# Patient Record
Sex: Male | Born: 1950 | Race: Black or African American | Hispanic: No | Marital: Married | Smoking: Never smoker
Health system: Southern US, Community
[De-identification: ages and names within clinical notes are randomized; demographics above are authoritative.]

## PROBLEM LIST (undated history)

## (undated) DIAGNOSIS — N529 Male erectile dysfunction, unspecified: Secondary | ICD-10-CM

## (undated) DIAGNOSIS — E119 Type 2 diabetes mellitus without complications: Secondary | ICD-10-CM

## (undated) DIAGNOSIS — I10 Essential (primary) hypertension: Secondary | ICD-10-CM

## (undated) DIAGNOSIS — M25512 Pain in left shoulder: Secondary | ICD-10-CM

## (undated) DIAGNOSIS — E7800 Pure hypercholesterolemia, unspecified: Secondary | ICD-10-CM

## (undated) DIAGNOSIS — I639 Cerebral infarction, unspecified: Secondary | ICD-10-CM

## (undated) DIAGNOSIS — R0683 Snoring: Secondary | ICD-10-CM

## (undated) DIAGNOSIS — J309 Allergic rhinitis, unspecified: Secondary | ICD-10-CM

## (undated) DIAGNOSIS — E782 Mixed hyperlipidemia: Secondary | ICD-10-CM

## (undated) DIAGNOSIS — R5383 Other fatigue: Secondary | ICD-10-CM

## (undated) DIAGNOSIS — M1990 Unspecified osteoarthritis, unspecified site: Secondary | ICD-10-CM

## (undated) DIAGNOSIS — K219 Gastro-esophageal reflux disease without esophagitis: Secondary | ICD-10-CM

## (undated) HISTORY — DX: Mixed hyperlipidemia: E78.2

## (undated) HISTORY — DX: Unspecified osteoarthritis, unspecified site: M19.90

## (undated) HISTORY — DX: Snoring: R06.83

## (undated) HISTORY — DX: Morbid (severe) obesity due to excess calories: E66.01

## (undated) HISTORY — DX: Other fatigue: R53.83

## (undated) HISTORY — DX: Male erectile dysfunction, unspecified: N52.9

## (undated) HISTORY — DX: Pain in left shoulder: M25.512

## (undated) HISTORY — DX: Allergic rhinitis, unspecified: J30.9

---

## 2001-06-08 ENCOUNTER — Ambulatory Visit (HOSPITAL_COMMUNITY): Admission: RE | Admit: 2001-06-08 | Discharge: 2001-06-08 | Payer: Self-pay | Admitting: *Deleted

## 2001-06-08 ENCOUNTER — Encounter (INDEPENDENT_AMBULATORY_CARE_PROVIDER_SITE_OTHER): Payer: Self-pay | Admitting: Specialist

## 2002-04-24 ENCOUNTER — Emergency Department (HOSPITAL_COMMUNITY): Admission: EM | Admit: 2002-04-24 | Discharge: 2002-04-24 | Payer: Self-pay | Admitting: Emergency Medicine

## 2002-04-24 ENCOUNTER — Encounter: Payer: Self-pay | Admitting: Gastroenterology

## 2004-04-05 HISTORY — PX: SHOULDER OPEN ROTATOR CUFF REPAIR: SHX2407

## 2005-03-01 ENCOUNTER — Encounter: Admission: RE | Admit: 2005-03-01 | Discharge: 2005-03-01 | Payer: Self-pay | Admitting: Family Medicine

## 2005-10-14 ENCOUNTER — Emergency Department (HOSPITAL_COMMUNITY): Admission: EM | Admit: 2005-10-14 | Discharge: 2005-10-14 | Payer: Self-pay | Admitting: Emergency Medicine

## 2005-10-22 ENCOUNTER — Encounter: Admission: RE | Admit: 2005-10-22 | Discharge: 2005-10-22 | Payer: Self-pay | Admitting: Family Medicine

## 2005-11-04 ENCOUNTER — Encounter: Admission: RE | Admit: 2005-11-04 | Discharge: 2005-11-04 | Payer: Self-pay | Admitting: Family Medicine

## 2005-11-25 ENCOUNTER — Encounter: Admission: RE | Admit: 2005-11-25 | Discharge: 2005-11-25 | Payer: Self-pay | Admitting: Family Medicine

## 2005-11-25 ENCOUNTER — Other Ambulatory Visit: Admission: RE | Admit: 2005-11-25 | Discharge: 2005-11-25 | Payer: Self-pay | Admitting: Interventional Radiology

## 2005-11-25 ENCOUNTER — Encounter (INDEPENDENT_AMBULATORY_CARE_PROVIDER_SITE_OTHER): Payer: Self-pay | Admitting: *Deleted

## 2006-07-21 ENCOUNTER — Emergency Department (HOSPITAL_COMMUNITY): Admission: EM | Admit: 2006-07-21 | Discharge: 2006-07-21 | Payer: Self-pay | Admitting: Emergency Medicine

## 2006-08-24 ENCOUNTER — Encounter: Admission: RE | Admit: 2006-08-24 | Discharge: 2006-08-24 | Payer: Self-pay | Admitting: Family Medicine

## 2006-08-30 ENCOUNTER — Encounter: Admission: RE | Admit: 2006-08-30 | Discharge: 2006-08-30 | Payer: Self-pay | Admitting: Family Medicine

## 2006-12-02 IMAGING — US US BIOPSY
1 series · 8 of 8 positions shown · non-contrast
Comparison: 11/04/05.

CLINICAL DATA: 55 year old male with a left thyroid mass demonstrated on a carotid ultrasound with a subsequent thyroid ultrasound performed 11/04/05 confirming the left thyroid mass.  
ULTRASOUND GUIDED LEFT THYROID MASS FNA BIOPSY:

[Series 1: unknown · 0.07mm/px · 8 of 8 slices shown]
[im 1/8]
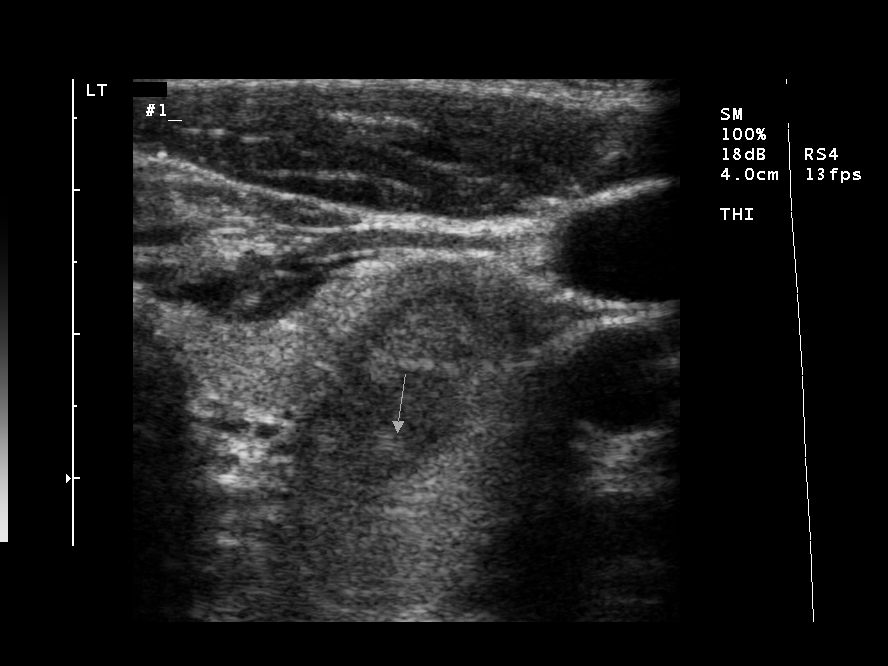
[im 2/8]
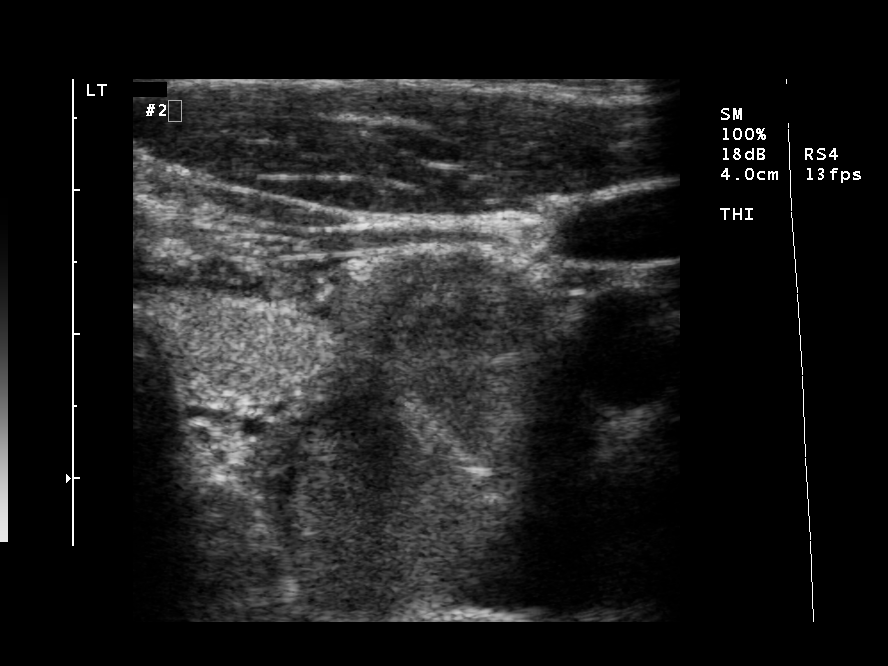
[im 3/8]
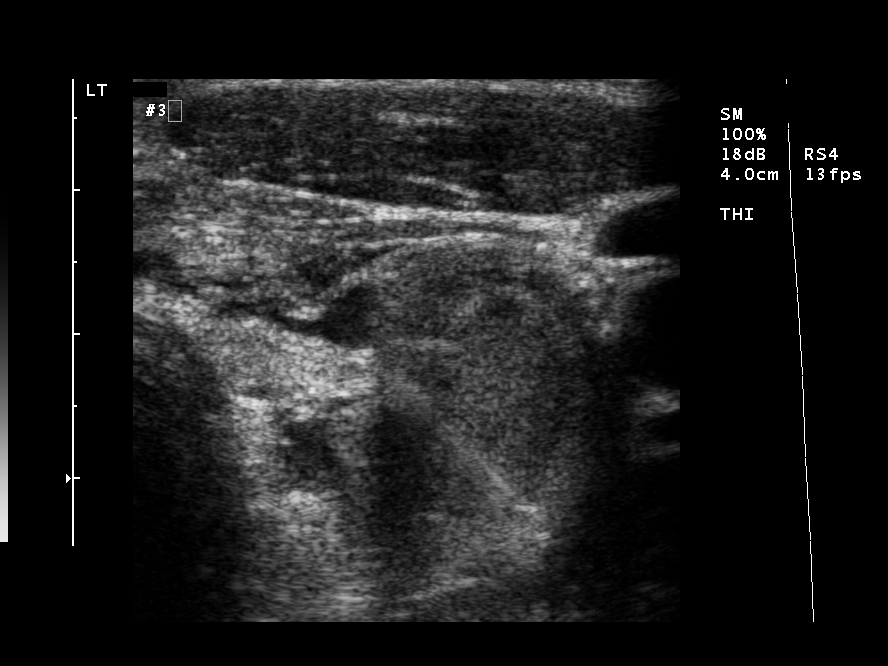
[im 4/8]
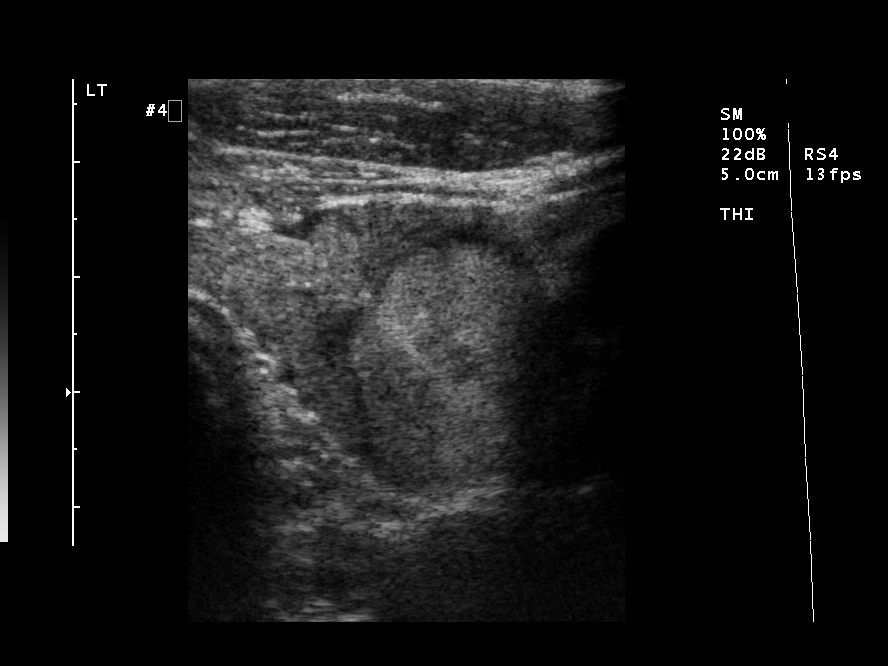
[im 5/8]
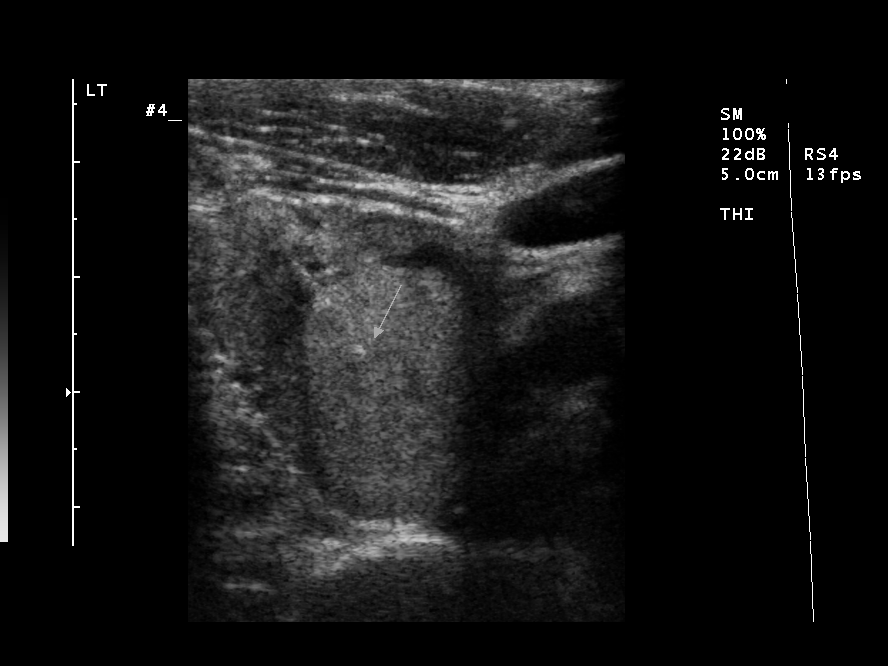
[im 6/8]
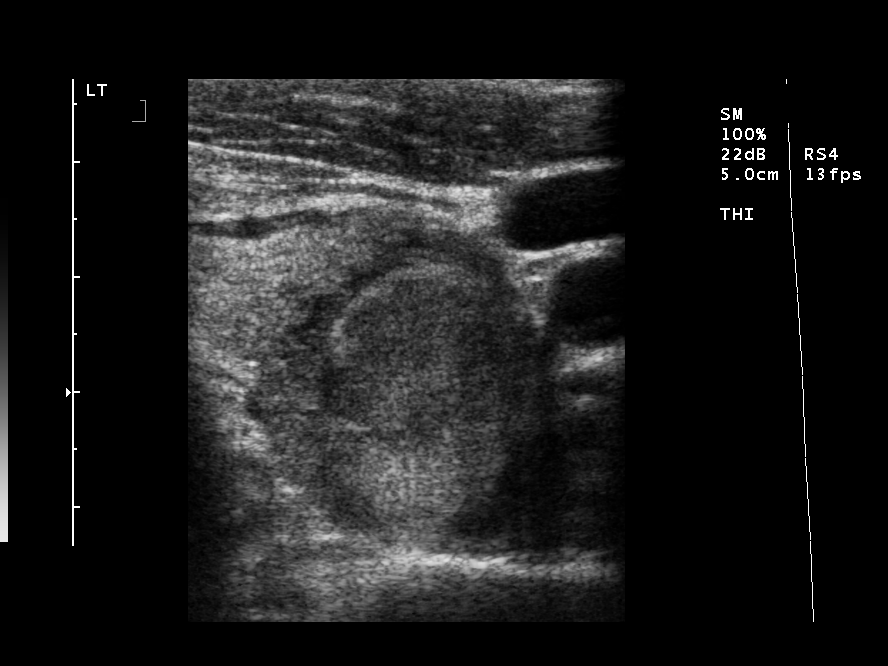
[im 7/8]
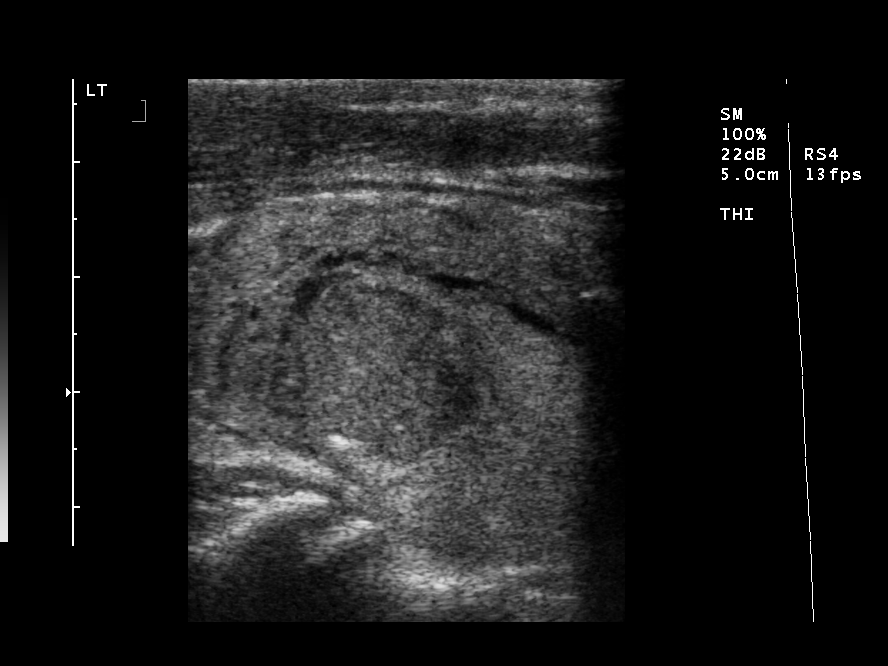
[im 8/8]
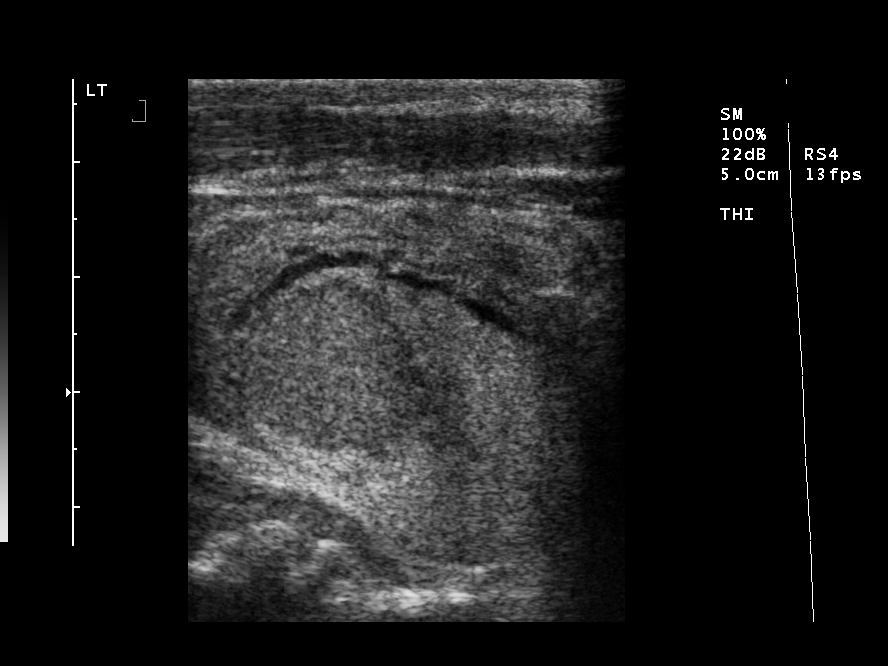

[8 of 8 positions shown; findings below may reference images not displayed]

Radiologist:  Edwuiin Gahona, M.D.
Guidance:  Ultrasound. 
Complications:  No immediate complications. 
Procedure/Findings:  Informed consent was obtained from the patient. 
Preliminary ultrasound was performed of the left thyroid lobe redemonstrating the heterogeneous left thyroid mass.  Under sterile conditions and local anesthesia, 21 gauge Inrad needles were utilized to perform FNA biopsy four times.  Samples were prepared for the cytopathologist.  Post procedure imaging demonstrated no complication.  The patient tolerated the procedure well.
IMPRESSION: 1.  Ultrasound guided left thyroid mass FNA biopsy (four).

## 2006-12-17 ENCOUNTER — Emergency Department (HOSPITAL_COMMUNITY): Admission: EM | Admit: 2006-12-17 | Discharge: 2006-12-17 | Payer: Self-pay | Admitting: Emergency Medicine

## 2007-04-06 DIAGNOSIS — I639 Cerebral infarction, unspecified: Secondary | ICD-10-CM

## 2007-04-06 HISTORY — DX: Cerebral infarction, unspecified: I63.9

## 2007-06-13 ENCOUNTER — Encounter: Admission: RE | Admit: 2007-06-13 | Discharge: 2007-06-13 | Payer: Self-pay | Admitting: Family Medicine

## 2007-07-05 ENCOUNTER — Encounter: Admission: RE | Admit: 2007-07-05 | Discharge: 2007-07-05 | Payer: Self-pay | Admitting: Family Medicine

## 2007-07-28 IMAGING — CR DG CHEST 1V PORT
1 series · 1 of 1 positions shown · non-contrast
Comparison: None.

CLINICAL DATA: Chest pain. 
 PORTABLE CHEST ? 1 VIEW:

[view not recorded]
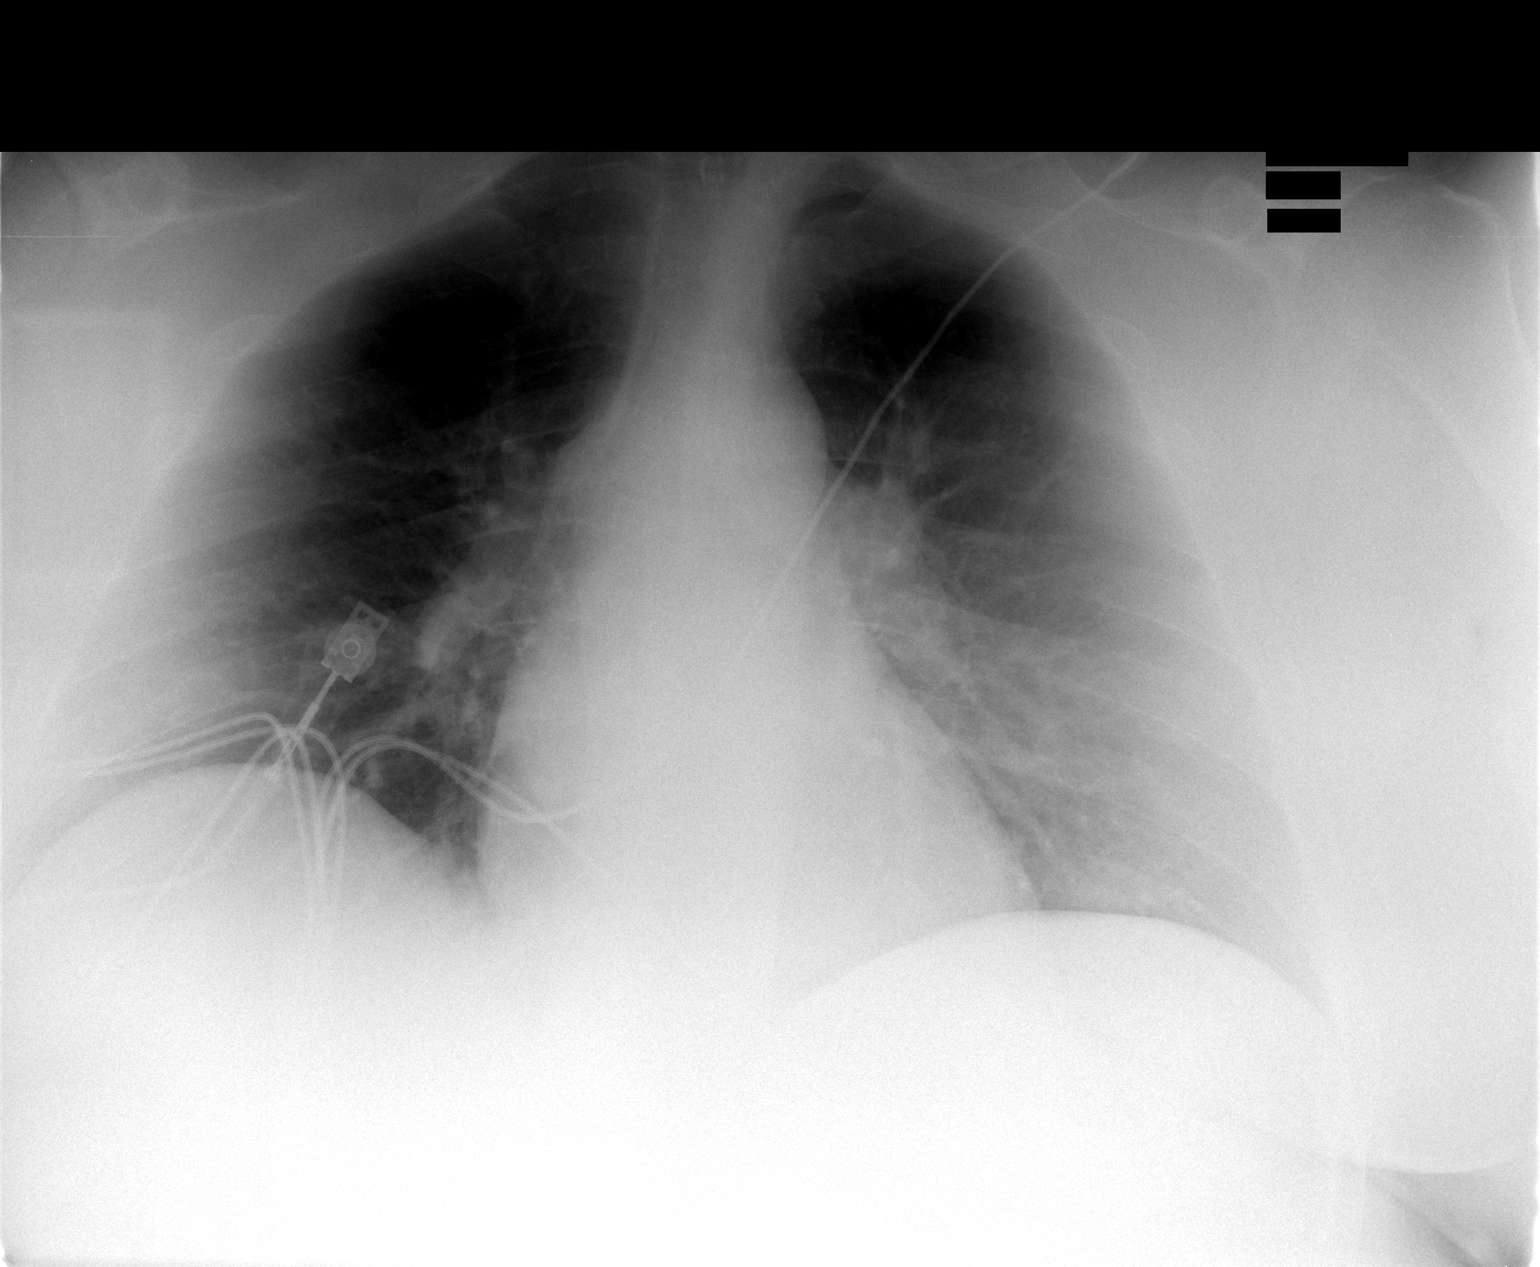

[1 of 1 positions shown; findings below may reference images not displayed]

FINDINGS: Heart and vascularity within normal limits.  Lungs clear.  Osseous structures intact in one view.  The patient is in a lordotic position.
IMPRESSION: No active disease.

## 2007-11-29 ENCOUNTER — Emergency Department (HOSPITAL_COMMUNITY): Admission: EM | Admit: 2007-11-29 | Discharge: 2007-11-29 | Payer: Self-pay | Admitting: Emergency Medicine

## 2007-12-13 ENCOUNTER — Ambulatory Visit (HOSPITAL_COMMUNITY): Admission: RE | Admit: 2007-12-13 | Discharge: 2007-12-13 | Payer: Self-pay | Admitting: Orthopaedic Surgery

## 2009-05-30 ENCOUNTER — Encounter: Admission: RE | Admit: 2009-05-30 | Discharge: 2009-05-30 | Payer: Self-pay | Admitting: Family Medicine

## 2011-01-06 LAB — COMPREHENSIVE METABOLIC PANEL
ALT: 22
Alkaline Phosphatase: 73
Calcium: 9.9
Creatinine, Ser: 1.23
Glucose, Bld: 500 — ABNORMAL HIGH
Sodium: 130 — ABNORMAL LOW
Total Bilirubin: 1
Total Protein: 7.1

## 2011-01-06 LAB — DIFFERENTIAL
Basophils Absolute: 0
Basophils Relative: 0
Eosinophils Absolute: 0.1
Monocytes Relative: 10
Neutro Abs: 2.2

## 2011-01-06 LAB — CBC
Hemoglobin: 16.8
Platelets: 476 — ABNORMAL HIGH

## 2011-01-06 LAB — PROTIME-INR: INR: 1

## 2011-01-15 LAB — POCT I-STAT CREATININE: Operator id: 277751

## 2011-01-15 LAB — I-STAT 8, (EC8 V) (CONVERTED LAB)
Acid-Base Excess: 1
Chloride: 106
Glucose, Bld: 109 — ABNORMAL HIGH
Hemoglobin: 17
Operator id: 277751
Sodium: 138
pCO2, Ven: 29.3 — ABNORMAL LOW

## 2011-12-17 ENCOUNTER — Encounter (HOSPITAL_BASED_OUTPATIENT_CLINIC_OR_DEPARTMENT_OTHER): Payer: Medicare Other | Attending: General Surgery

## 2011-12-17 DIAGNOSIS — S81009A Unspecified open wound, unspecified knee, initial encounter: Secondary | ICD-10-CM | POA: Insufficient documentation

## 2011-12-17 DIAGNOSIS — L97809 Non-pressure chronic ulcer of other part of unspecified lower leg with unspecified severity: Secondary | ICD-10-CM | POA: Insufficient documentation

## 2011-12-17 DIAGNOSIS — W268XXA Contact with other sharp object(s), not elsewhere classified, initial encounter: Secondary | ICD-10-CM | POA: Insufficient documentation
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
## 2012-01-07 ENCOUNTER — Encounter (HOSPITAL_BASED_OUTPATIENT_CLINIC_OR_DEPARTMENT_OTHER): Payer: Medicare Other | Attending: General Surgery

## 2012-01-07 DIAGNOSIS — M79609 Pain in unspecified limb: Secondary | ICD-10-CM | POA: Insufficient documentation

## 2012-01-07 DIAGNOSIS — E1169 Type 2 diabetes mellitus with other specified complication: Secondary | ICD-10-CM | POA: Insufficient documentation

## 2012-01-07 DIAGNOSIS — L97809 Non-pressure chronic ulcer of other part of unspecified lower leg with unspecified severity: Secondary | ICD-10-CM | POA: Insufficient documentation

## 2012-01-14 ENCOUNTER — Encounter (HOSPITAL_BASED_OUTPATIENT_CLINIC_OR_DEPARTMENT_OTHER): Payer: Medicaid Other

## 2012-02-04 ENCOUNTER — Encounter (HOSPITAL_BASED_OUTPATIENT_CLINIC_OR_DEPARTMENT_OTHER): Payer: Medicare Other | Attending: General Surgery

## 2012-02-04 DIAGNOSIS — E1169 Type 2 diabetes mellitus with other specified complication: Secondary | ICD-10-CM | POA: Insufficient documentation

## 2012-02-04 DIAGNOSIS — L97809 Non-pressure chronic ulcer of other part of unspecified lower leg with unspecified severity: Secondary | ICD-10-CM | POA: Insufficient documentation
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
## 2012-03-10 ENCOUNTER — Encounter (HOSPITAL_BASED_OUTPATIENT_CLINIC_OR_DEPARTMENT_OTHER): Payer: Medicaid Other

## 2012-05-29 ENCOUNTER — Emergency Department (HOSPITAL_COMMUNITY): Payer: No Typology Code available for payment source

## 2012-05-29 ENCOUNTER — Encounter (HOSPITAL_COMMUNITY): Payer: Self-pay | Admitting: Emergency Medicine

## 2012-05-29 ENCOUNTER — Emergency Department (HOSPITAL_COMMUNITY)
Admission: EM | Admit: 2012-05-29 | Discharge: 2012-05-29 | Disposition: A | Discharge status: Home / Self Care | Payer: No Typology Code available for payment source | Attending: Emergency Medicine | Admitting: Emergency Medicine

## 2012-05-29 DIAGNOSIS — E119 Type 2 diabetes mellitus without complications: Secondary | ICD-10-CM | POA: Insufficient documentation

## 2012-05-29 DIAGNOSIS — IMO0002 Reserved for concepts with insufficient information to code with codable children: Secondary | ICD-10-CM | POA: Insufficient documentation

## 2012-05-29 DIAGNOSIS — I10 Essential (primary) hypertension: Secondary | ICD-10-CM | POA: Insufficient documentation

## 2012-05-29 DIAGNOSIS — Y92410 Unspecified street and highway as the place of occurrence of the external cause: Secondary | ICD-10-CM | POA: Insufficient documentation

## 2012-05-29 DIAGNOSIS — S161XXA Strain of muscle, fascia and tendon at neck level, initial encounter: Secondary | ICD-10-CM

## 2012-05-29 DIAGNOSIS — Y9389 Activity, other specified: Secondary | ICD-10-CM | POA: Insufficient documentation

## 2012-05-29 DIAGNOSIS — S139XXA Sprain of joints and ligaments of unspecified parts of neck, initial encounter: Secondary | ICD-10-CM | POA: Insufficient documentation

## 2012-05-29 HISTORY — DX: Essential (primary) hypertension: I10

## 2012-05-29 MED ORDER — HYDROCODONE-ACETAMINOPHEN 5-325 MG PO TABS: 1.0000 | ORAL_TABLET | ORAL | Status: DC | PRN

## 2012-05-29 MED ORDER — HYDROMORPHONE HCL PF 1 MG/ML IJ SOLN
0.5000 mg | Freq: Once | INTRAMUSCULAR | Status: AC
  Administered 2012-05-29: 0.5 mg via INTRAMUSCULAR
  Filled 2012-05-29: qty 1

## 2012-05-29 MED ORDER — IBUPROFEN 600 MG PO TABS: 600.0000 mg | ORAL_TABLET | Freq: Three times a day (TID) | ORAL | Status: DC | PRN
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
## 2013-06-15 ENCOUNTER — Emergency Department (HOSPITAL_COMMUNITY): Payer: Medicare Other

## 2013-06-15 ENCOUNTER — Encounter (HOSPITAL_COMMUNITY): Payer: Self-pay | Admitting: General Practice

## 2013-06-15 ENCOUNTER — Observation Stay (HOSPITAL_COMMUNITY)
Admission: EM | Admit: 2013-06-15 | Discharge: 2013-06-16 | Disposition: A | Discharge status: Home / Self Care | Payer: Medicare Other | Attending: Internal Medicine | Admitting: Internal Medicine

## 2013-06-15 DIAGNOSIS — R0602 Shortness of breath: Secondary | ICD-10-CM | POA: Diagnosis not present

## 2013-06-15 DIAGNOSIS — Z79899 Other long term (current) drug therapy: Secondary | ICD-10-CM | POA: Insufficient documentation

## 2013-06-15 DIAGNOSIS — R079 Chest pain, unspecified: Secondary | ICD-10-CM | POA: Diagnosis present

## 2013-06-15 DIAGNOSIS — E785 Hyperlipidemia, unspecified: Secondary | ICD-10-CM | POA: Insufficient documentation

## 2013-06-15 DIAGNOSIS — Z888 Allergy status to other drugs, medicaments and biological substances status: Secondary | ICD-10-CM | POA: Diagnosis not present

## 2013-06-15 DIAGNOSIS — K219 Gastro-esophageal reflux disease without esophagitis: Secondary | ICD-10-CM | POA: Diagnosis not present

## 2013-06-15 DIAGNOSIS — N390 Urinary tract infection, site not specified: Secondary | ICD-10-CM | POA: Diagnosis not present

## 2013-06-15 DIAGNOSIS — R197 Diarrhea, unspecified: Secondary | ICD-10-CM | POA: Insufficient documentation

## 2013-06-15 DIAGNOSIS — R42 Dizziness and giddiness: Secondary | ICD-10-CM | POA: Insufficient documentation

## 2013-06-15 DIAGNOSIS — R0789 Other chest pain: Secondary | ICD-10-CM | POA: Diagnosis not present

## 2013-06-15 DIAGNOSIS — Z23 Encounter for immunization: Secondary | ICD-10-CM | POA: Diagnosis not present

## 2013-06-15 DIAGNOSIS — I10 Essential (primary) hypertension: Secondary | ICD-10-CM | POA: Diagnosis not present

## 2013-06-15 DIAGNOSIS — R5381 Other malaise: Secondary | ICD-10-CM | POA: Insufficient documentation

## 2013-06-15 DIAGNOSIS — Z7982 Long term (current) use of aspirin: Secondary | ICD-10-CM | POA: Insufficient documentation

## 2013-06-15 DIAGNOSIS — R112 Nausea with vomiting, unspecified: Secondary | ICD-10-CM | POA: Diagnosis not present

## 2013-06-15 DIAGNOSIS — E119 Type 2 diabetes mellitus without complications: Secondary | ICD-10-CM | POA: Diagnosis not present

## 2013-06-15 DIAGNOSIS — R5383 Other fatigue: Secondary | ICD-10-CM

## 2013-06-15 HISTORY — DX: Type 2 diabetes mellitus without complications: E11.9

## 2013-06-15 HISTORY — DX: Pure hypercholesterolemia, unspecified: E78.00

## 2013-06-15 HISTORY — DX: Gastro-esophageal reflux disease without esophagitis: K21.9

## 2013-06-15 HISTORY — DX: Cerebral infarction, unspecified: I63.9

## 2013-06-15 LAB — CBC WITH DIFFERENTIAL/PLATELET
BASOS ABS: 0.1 10*3/uL (ref 0.0–0.1)
Basophils Relative: 1 % (ref 0–1)
Eosinophils Absolute: 0.1 10*3/uL (ref 0.0–0.7)
Eosinophils Relative: 2 % (ref 0–5)
HEMATOCRIT: 43.1 % (ref 39.0–52.0)
HEMOGLOBIN: 15.5 g/dL (ref 13.0–17.0)
LYMPHS PCT: 39 % (ref 12–46)
Lymphs Abs: 2.5 10*3/uL (ref 0.7–4.0)
MCH: 31.6 pg (ref 26.0–34.0)
MCHC: 36 g/dL (ref 30.0–36.0)
MCV: 88 fL (ref 78.0–100.0)
MONO ABS: 0.5 10*3/uL (ref 0.1–1.0)
MONOS PCT: 7 % (ref 3–12)
NEUTROS ABS: 3.4 10*3/uL (ref 1.7–7.7)
Neutrophils Relative %: 51 % (ref 43–77)
Platelets: 404 10*3/uL — ABNORMAL HIGH (ref 150–400)
RBC: 4.9 MIL/uL (ref 4.22–5.81)
RDW: 12.8 % (ref 11.5–15.5)
WBC: 6.6 10*3/uL (ref 4.0–10.5)

## 2013-06-15 LAB — CBG MONITORING, ED
GLUCOSE-CAPILLARY: 400 mg/dL — AB (ref 70–99)
Glucose-Capillary: 343 mg/dL — ABNORMAL HIGH (ref 70–99)

## 2013-06-15 LAB — BASIC METABOLIC PANEL
BUN: 11 mg/dL (ref 6–23)
CHLORIDE: 94 meq/L — AB (ref 96–112)
CO2: 22 meq/L (ref 19–32)
CREATININE: 1.09 mg/dL (ref 0.50–1.35)
Calcium: 9 mg/dL (ref 8.4–10.5)
GFR calc Af Amer: 83 mL/min — ABNORMAL LOW (ref >90)
GFR calc non Af Amer: 71 mL/min — ABNORMAL LOW (ref >90)
Glucose, Bld: 431 mg/dL — ABNORMAL HIGH (ref 70–99)
Potassium: 4.7 mEq/L (ref 3.7–5.3)
Sodium: 134 mEq/L — ABNORMAL LOW (ref 137–147)

## 2013-06-15 LAB — URINALYSIS, ROUTINE W REFLEX MICROSCOPIC
BILIRUBIN URINE: NEGATIVE
Glucose, UA: >1000 mg/dL — AB
Ketones, ur: >80 mg/dL — AB
Leukocytes, UA: NEGATIVE
NITRITE: POSITIVE — AB
PH: 5 (ref 5.0–8.0)
Protein, ur: NEGATIVE mg/dL
SPECIFIC GRAVITY, URINE: 1.039 — AB (ref 1.005–1.030)
Urobilinogen, UA: 0.2 mg/dL (ref 0.0–1.0)

## 2013-06-15 LAB — HEMOGLOBIN A1C
HEMOGLOBIN A1C: 10.8 % — AB (ref <5.7)
Mean Plasma Glucose: 263 mg/dL — ABNORMAL HIGH (ref <117)

## 2013-06-15 LAB — URINE MICROSCOPIC-ADD ON

## 2013-06-15 LAB — I-STAT TROPONIN, ED: Troponin i, poc: 0 ng/mL (ref 0.00–0.08)

## 2013-06-15 LAB — GLUCOSE, CAPILLARY
GLUCOSE-CAPILLARY: 274 mg/dL — AB (ref 70–99)
Glucose-Capillary: 356 mg/dL — ABNORMAL HIGH (ref 70–99)

## 2013-06-15 LAB — TROPONIN I: Troponin I: <0.3 ng/mL (ref <0.30)

## 2013-06-15 MED ORDER — INSULIN ASPART 100 UNIT/ML ~~LOC~~ SOLN
0.0000 [IU] | SUBCUTANEOUS | Status: DC
Start: 2013-06-15 — End: 2013-06-15
  Administered 2013-06-15: 9 [IU] via SUBCUTANEOUS

## 2013-06-15 MED ORDER — CEFTRIAXONE SODIUM 1 G IJ SOLR
1.0000 g | Freq: Once | INTRAMUSCULAR | Status: AC
Start: 2013-06-15 — End: 2013-06-15
  Administered 2013-06-15: 1 g via INTRAVENOUS
  Filled 2013-06-15: qty 10

## 2013-06-15 MED ORDER — ASPIRIN EC 81 MG PO TBEC
81.0000 mg | DELAYED_RELEASE_TABLET | Freq: Every morning | ORAL | Status: DC
  Administered 2013-06-16: 81 mg via ORAL
  Filled 2013-06-15: qty 1

## 2013-06-15 MED ORDER — PANTOPRAZOLE SODIUM 40 MG PO TBEC
40.0000 mg | DELAYED_RELEASE_TABLET | Freq: Two times a day (BID) | ORAL | Status: DC
  Administered 2013-06-15 – 2013-06-16 (×2): 40 mg via ORAL
  Filled 2013-06-15 (×2): qty 1

## 2013-06-15 MED ORDER — PNEUMOCOCCAL VAC POLYVALENT 25 MCG/0.5ML IJ INJ
0.5000 mL | INJECTION | INTRAMUSCULAR | Status: AC
  Administered 2013-06-16: 0.5 mL via INTRAMUSCULAR
  Filled 2013-06-15: qty 0.5

## 2013-06-15 MED ORDER — SODIUM CHLORIDE 0.9 % IV SOLN
INTRAVENOUS | Status: DC
  Administered 2013-06-15 – 2013-06-16 (×2): via INTRAVENOUS

## 2013-06-15 MED ORDER — CARVEDILOL 25 MG PO TABS
25.0000 mg | ORAL_TABLET | Freq: Two times a day (BID) | ORAL | Status: DC
Start: 2013-06-15 — End: 2013-06-16
  Administered 2013-06-15 – 2013-06-16 (×2): 25 mg via ORAL
  Filled 2013-06-15 (×3): qty 1

## 2013-06-15 MED ORDER — INSULIN ASPART 100 UNIT/ML ~~LOC~~ SOLN
0.0000 [IU] | Freq: Three times a day (TID) | SUBCUTANEOUS | Status: DC
  Administered 2013-06-15 – 2013-06-16 (×3): 5 [IU] via SUBCUTANEOUS

## 2013-06-15 MED ORDER — ATORVASTATIN CALCIUM 20 MG PO TABS
20.0000 mg | ORAL_TABLET | Freq: Every day | ORAL | Status: DC
  Filled 2013-06-15 (×2): qty 1

## 2013-06-15 MED ORDER — HEPARIN SODIUM (PORCINE) 5000 UNIT/ML IJ SOLN
5000.0000 [IU] | Freq: Three times a day (TID) | INTRAMUSCULAR | Status: DC
  Filled 2013-06-15 (×5): qty 1

## 2013-06-15 MED ORDER — NITROGLYCERIN 0.4 MG SL SUBL: 0.4000 mg | SUBLINGUAL_TABLET | SUBLINGUAL | Status: DC | PRN

## 2013-06-15 MED ORDER — AMLODIPINE BESYLATE 5 MG PO TABS
5.0000 mg | ORAL_TABLET | Freq: Every day | ORAL | Status: DC
  Administered 2013-06-16: 5 mg via ORAL
  Filled 2013-06-15: qty 1

## 2013-06-15 MED ORDER — SODIUM CHLORIDE 0.9 % IV BOLUS (SEPSIS)
1000.0000 mL | Freq: Once | INTRAVENOUS | Status: AC
  Administered 2013-06-15: 1000 mL via INTRAVENOUS

## 2013-06-15 MED ORDER — SODIUM CHLORIDE 0.9 % IV BOLUS (SEPSIS)
500.0000 mL | Freq: Once | INTRAVENOUS | Status: AC
  Administered 2013-06-15: 500 mL via INTRAVENOUS

## 2013-06-15 NOTE — H&P (Signed)
Date: 06/15/2013               Patient Name:  Jeremy Farmer MRN: [id number]  DOB: 1950-11-27 Age / Sex: 63 y.o., male   PCP: Willow Ora, MD         Medical Service: Internal Medicine Teaching Service         Attending Physician: Dr. Farley Ly, MD    First Contact: Dr. Vivi Barrack Pager: [phone]  Second Contact: Dr. Leonia Reeves Pager: [phone]       After Hours (After 5p/  First Contact Pager: [phone]  weekends / holidays): Second Contact Pager: [phone]   Chief Complaint: Episodes of chest pain  History of Present Illness:  Jeremy Farmer is a 63 y.o. male with PMH DM2, HTN, GERD, HLD, gout who presents to the emergency room today with a chief complaint of transient episodes of chest pain.  The patient describes a short episode of chest "heaviness" occuring at 8am this morning when Jeremy Farmer was sitting down at his PCP's office ([facility]). Jeremy Farmer describes it as a brick sitting on his chest. The sensation went away on its own after 1 minute, and Jeremy Farmer chalked it off to heartburn. Jeremy Farmer denies diaphoresis or radiation. After an hour, Jeremy Farmer had another identical episode of chest pressure that again lasted 1 minute and resolved on its own. At this point EMS was called from the clinic. Jeremy Farmer says Jeremy Farmer felt the pressure starting back for a third time, but Jeremy Farmer was given 1 NTG and ASA en route to ED and this treated it. Jeremy Farmer denies any episodes of chest pressure since presentation.  This sensation has never happened before. Jeremy Farmer denies recent injury to chest or new/different activity. Jeremy Farmer does say that over the last 2 weeks Jeremy Farmer has noted polyuria, polydipsia, dyspnea on exertion, and generalized weakness. Jeremy Farmer has DM2, diagnosed in 2009 and formerly on insulin and metformin, but it has been so well controlled that his PCP stopped all therapies about 6 months ago. Last A1C was 6.0 on 03/16/2013 per Care Everywhere.   Jeremy Farmer also reports 2 pillow orthopnea, but this has been stable over the past few years.  Denies leg swelling or weight gain (Jeremy Farmer says Jeremy Farmer has actually lost weight). Jeremy Farmer does not have a history of heart failure. Jeremy Farmer tells Jeremy Farmer Jeremy Farmer thinks Jeremy Farmer had a normal 2D echo last year.  Of note, patient has had N/V/D x 2 days, consisting of approximately 4 episodes of watery, nonbloody diarrhea per day and 2 episodes of nonbloody emesis per day. Jeremy Farmer denies abdominal pain and says his symptoms are improving. Jeremy Farmer admits Jeremy Farmer has had no appetite during this period. Denies new foods or eating out at a new place. No sick contacts.  His wife is from [place] and they spend half the year there. Jeremy Farmer recently got back. Jeremy Farmer tells Jeremy Farmer Jeremy Farmer eats mostly fruits and vegetables when Jeremy Farmer is living there, and his wife "makes my medicines from roots and berries." Jeremy Farmer confirms that Jeremy Farmer does get his medicine from a pharmacy, and take it, when Jeremy Farmer is living in the Jeremy Farmer.  I was able to speak with his PCP on the phone (Dr. Duanne Guess). She does not recall a significant cardiac history other than hypertension. The office was able to send me old records of an exercise stress test and 2D echo from [facility] in [place], [place] in 03/2009. Stress test was negative for ischemia to 87% predicted heart rate, 2D echo  report shows EF 55-60%, normal wall thickness and systolic function. No other recent cardiac studies.  Jeremy Farmer denies tobacco, alcohol, illicit drug use.  In the ED, Jeremy Farmer was given IV ceftriaxone, a 500 cc normal saline fluid bolus, followed by 1 L normal saline fluid bolus.   Meds:  Prescription Sig. Disp. Refills Start Date End Date Status  aspirin 325 MG tablet Take 325 mg by mouth every 4 (four) hours as needed. For pain     Active  lisinopril (PRINIVIL,ZESTRIL) 40 MG tablet TAKE ONE TABLET BY MOUTH EVERY DAY FOR BLOOD PRESSURE 90 tablet  6 01/21/2012  Active  cetirizine (ZYRTEC) 10 MG tablet Take 1 tablet (10 mg total) by mouth daily. 30 tablet  1 12/21/2012 12/21/2013 Active  esomeprazole (NEXIUM) 40 mg capsule Take 1 capsule (40 mg  total) by mouth daily. 30 capsule  2 03/07/2013 03/07/2014 Active  triamcinolone (KENALOG) 0.1% cream Use as directed 85.2 g  0 03/16/2013  Active  fluticasone (FLONASE) 50 mcg/actuation nasal spray 2 sprays by Nasal route daily. 16 g  2 03/16/2013 03/16/2014 Active  carvedilol (COREG) 25 mg tablet Take 1 tablet (25 mg total) by mouth 2 (two) times daily. 180 tablet  3 03/16/2013  Active  amLODIPine (NORVASC) 5 mg tablet Take 1 tablet (5 mg total) by mouth daily. 90 tablet  1 03/16/2013 03/16/2014 Active  indomethacin (INDOCIN) 50 mg capsule Take 1 capsule (50 mg total) by mouth 3 (three) times a day as needed. 90 capsule  0 03/16/2013  Active  zoster vaccine live, PF, (ZOSTAVAX) 16109 UNT/0.65ML injection Inject 0.65 mLs (19,400 Units total) into the skin daily. Please administer zoster vaccine. DX: shingles prevention 1 vial  0 03/16/2013  Active  sildenafil citrate (VIAGRA) 50 mg tablet Take 1 tablet (50 mg total) by mouth as needed for Erectile Dysfunction. 20 tablet  1 04/13/2013 04/13/2014 Active  CRESTOR 10 MG tablet TAKE ONE TABLET BY MOUTH IN THE EVENING 90 tablet  0 05/23/2013  Active    Allergies: Allergies as of 06/15/2013 - Review Complete 06/15/2013  Allergen Reaction Noted  . Peanut-containing drug products Anaphylaxis and Swelling 05/29/2012   Past Medical History  Diagnosis Date  . Diabetes mellitus without complication   . Hypertension    No past surgical history on file. No family history on file. History   Social History  . Marital Status: Married    Spouse Name: N/A    Number of Children: N/A  . Years of Education: N/A   Occupational History  . Not on file.   Social History Main Topics  . Smoking status: Never Smoker   . Smokeless tobacco: Not on file  . Alcohol Use: No  . Drug Use: No  . Sexual Activity: Not on file   Other Topics Concern  . Not on file   Social History Narrative  . No narrative on file    Review of Systems: Pertinent items are  noted in HPI.  Physical Exam: Blood pressure 113/66, pulse 54, temperature 97.5 F (36.4 C), temperature source Oral, resp. rate 16, SpO2 98.00%. Physical Exam  Constitutional: Jeremy Farmer is oriented to person, place, and time and well-developed, well-nourished, and in no distress.  HENT:  Head: Normocephalic and atraumatic.  Mouth/Throat: Mucous membranes are dry.  Eyes: Conjunctivae and EOM are normal. Pupils are equal, round, and reactive to light.  Neck: Normal range of motion. Neck supple.  Cardiovascular: Normal rate, regular rhythm, normal heart sounds and intact distal pulses.  Exam reveals no  gallop and no friction rub.   No murmur heard. Pulmonary/Chest: Effort normal and breath sounds normal. No respiratory distress. Jeremy Farmer has no wheezes. Jeremy Farmer has no rales. Jeremy Farmer exhibits no tenderness.  Abdominal: Soft. Bowel sounds are normal. Jeremy Farmer exhibits no distension and no mass. There is no tenderness. There is no rebound and no guarding.  Musculoskeletal: Normal range of motion. Jeremy Farmer exhibits no edema and no tenderness.  Neurological: Jeremy Farmer is alert and oriented to person, place, and time. No cranial nerve deficit. GCS score is 15.  Skin: Skin is warm and dry. Jeremy Farmer is not diaphoretic.  Psychiatric: Affect normal.    Lab results: Basic Metabolic Panel:  Recent Labs  40/98/1103/13/15 1012  NA 134*  K 4.7  CL 94*  CO2 22  GLUCOSE 431*  BUN 11  CREATININE 1.09  CALCIUM 9.0   CBC:  Recent Labs  06/15/13 1012  WBC 6.6  NEUTROABS 3.4  HGB 15.5  HCT 43.1  MCV 88.0  PLT 404*   CBG:  Recent Labs  06/15/13 1015 06/15/13 1404 06/15/13 1633  GLUCAP 400* 343* 356*   Urinalysis:  Recent Labs  06/15/13 1103  COLORURINE YELLOW  LABSPEC 1.039*  PHURINE 5.0  GLUCOSEU >1000*  HGBUR TRACE*  BILIRUBINUR NEGATIVE  KETONESUR >80*  PROTEINUR NEGATIVE  UROBILINOGEN 0.2  NITRITE POSITIVE*  LEUKOCYTESUR NEGATIVE    Imaging results:  Dg Chest 2 View  06/15/2013   CLINICAL DATA:  Mid chest pain,  hypertension, dizziness  EXAM: CHEST  2 VIEW  COMPARISON:  05/29/2012  FINDINGS: The heart size and mediastinal contours are within normal limits. Both lungs are clear. The visualized skeletal structures are unremarkable.  IMPRESSION: No active cardiopulmonary disease.   Electronically Signed   By: Ruel Favorsrevor  Shick M.D.   On: 06/15/2013 10:54    Other results: EKG: Normal sinus rhythm. T-wave inversions in V4-V6. No prior EKGs.  Assessment & Plan by Problem: Roetta SessionsMilton L Eisenhart is a 63 y.o. male with PMH DM2, HTN, GERD, HLD, gout who presents to the emergency room today with a chief complaint of transient episodes of chest pain.  #Atypical chest pain, rule out MI - Patient reports 2-3 transient episodes of chest pressure lasting less than 1 minute each, nonradiating, responsive to nitroglycerin. EKG shows nonspecific T wave inversions in the lateral precordial leads. Jeremy Farmer has no history of CAD, but risk factors include diabetes, hypertension, hyperlipidemia. TIMI score 2 (for risk factors, ASA use), 8% risk at 14 days of: all-cause mortality, new or recurrent MI, or severe recurrent ischemia requiring urgent revascularization. Wells score is 0, making him low risk for pulmonary embolism. Jeremy Farmer has history of GERD and takes daily Nexium for this. Muskuloskeletal chest pain is also in the differential as Jeremy Farmer has been vomiting. - Admit to IMTS, telemetry, observation - Trending troponins, point-of-care was negative - ASA 81mg  daily - Repeat EKG in am - BMP in am  #UTI - Patient endorses polyuria on review of systems. Denies dysuria. UA shows positive nitrites, too numerous to count white blood cells, rare squams, few bacteria. Jeremy Farmer got IV ceftriaxone in ED. Jeremy Farmer does not have fever or leukocytosis. Jeremy Farmer tells us Jeremy Farmer has a history of UTI a few years ago. - Continue IV ceftriaxone - Follow up urine cultures - CBC in am  #DM2 with hyperglycemia - History of well controlled DM2 with A1C of 6.0 on 03/16/2013. Jeremy Farmer takes no  oral hypoglycemics or insulin at home. Glucose elevated to 400 on admission, and Jeremy Farmer reports polyuria/polydipsia  x2 weeks. Trigger may be infection (UTI) vs. ACS (chest pressure) vs. Dietary noncompliance (since returning from [place]). DKA less likely given good prior control, but it is important to at least consider DKA/hyperglycemia as a cause of his N/V/D/chest pain instead of a result. Jeremy Farmer has no vital sign abnormalities, no tachypnea, normal CO2, and appears well on exam. We will treat with short acting insulin overnight and add a longer acting insulin tomorrow as needed. - Hemoglobin A1C - SSI, sensitive q4h - CBG checks q4h  Glucose-Capillary  Date Value Ref Range Status  06/15/2013 356* 70 - 99 mg/dL Final  1/61/0960 454* 70 - 99 mg/dL Final  0/98/1191 478* 70 - 99 mg/dL Final  2/95/6213 086* 70 - 99 mg/dL Final    #Nausea, vomiting, diarrhea, resolving - No episodes since presentation. Anorexia x2 days, but feeling better and now asking to start a liquid diet. Jeremy Farmer denies dietary changes, but tells Jeremy Farmer Jeremy Farmer recently got back from [place]. - Stool pathogen panel if Jeremy Farmer provides a sample, no need for enteric precautions at this time - Full liquid diet as tolerated  #Mild anion gap acidosis - Anion gap 18, delta gap 6, delta ratio 3 suggesting AG acidosis and a concurrent metabolic alkalosis. Chloride is low and bicarbonate is normal. Jeremy Farmer has ketones in his urine. Likely starvation ketoacidosis combined with metabolic alkalosis secondary to GI losses from vomiting. DKA less likely given history of well controlled DM2, though Jeremy Farmer does have hyperglycemia so it is important to consider this. Jeremy Farmer still looks a bit dry on exam, so we will continue to fluid resuscitate him. - IVF resuscitation with an additional 1L NS bolus (for a total of 2.5L) followed by NS @150cc /hr x 12 hours - BMP in am  #Questionable orthopnea? - Patient reports 2 pillow orthopnea for years. There is mention in his clinic  notes of concern for sleep apnea. Jeremy Farmer has no signs of volume overload on exam. 2D echo was normal in 2010, but we have no recent studies.  - Monitor for signs and symptoms of volume overload and workup as indicated - Recommend sleep study as outpatient if it has not already been done  #HTN - Normotensive currently. - Continue home amlodipine 5 mg daily - Continue home Coreg 25 mg twice a day  #HLD - Continue statin, Lipitor 20 mg daily  #DVT PPX - Subq heparin  Dispo: Disposition is deferred at this time, awaiting improvement of current medical problems. Anticipated discharge in approximately 1-3 day(s).   The patient does have a current PCP Willow Ora, MD) and does not need an [facility] hospital follow-up appointment after discharge.  The patient does not have transportation limitations that hinder transportation to clinic appointments.  Signed: Vivi Barrack, MD 06/15/2013, 4:40 PM   Vivi Barrack, MD  [email or web address] Pager # [phone] Office # [phone]

## 2013-06-15 NOTE — ED Provider Notes (Signed)
I saw and evaluated the patient, reviewed the resident's note and I agree with the findings and plan.   EKG Interpretation   Date/Time:  Friday June 15 2013 09:45:53 EDT Ventricular Rate:  67 PR Interval:  178 QRS Duration: 90 QT Interval:  452 QTC Calculation: 477 R Axis:   28 Text Interpretation:  Sinus rhythm Borderline T abnormalities, lateral  leads ST elevation, consider inferior injury Borderline prolonged QT  interval Confirmed by Freida BusmanALLEN  MD, Alp Goldwater ([id number]) on 06/15/2013 9:50:14 AM     Patient here complaining of chest pain lasting about a minute x2 with associated dyspnea. He also notes polyuria and polydipsia with some dysuria. Labs show a significant UTI as was hyperglycemia. Patient placed on Rocephin and given IV fluids. Cardiac enzymes x1 is negative. Will admit patient to the medicine service for ACS rule out as well as treatment of his UTI and his hyperglycemia  Toy BakerAnthony T Lakita Sahlin, MD 06/15/13 1258

## 2013-06-15 NOTE — ED Notes (Addendum)
Pt was at family practice and developed chest pain; heavy and tightness and then eases off then comes back. EKG performed; inverted T wave. stress test in 2009 clean; no other cardiac hx. 1 nitro and aspirin given; chest pain free. Hasnt been feeling well for several weeks. Been off diabetes meds for 6 mos when A1C was cleared. Pt reports urinary frequency- "water just running through him."

## 2013-06-15 NOTE — ED Provider Notes (Signed)
CSN: [id number]     Arrival date & time 06/15/13  [id number] History   First MD Initiated Contact with Patient 06/15/13 0945     Chief Complaint  Patient presents with  . Chest Pain     (Consider location/radiation/quality/duration/timing/severity/associated sxs/prior Treatment) Patient is a 63 y.o. male presenting with chest pain. The history is provided by the patient. No language interpreter was used.  Chest Pain Associated symptoms: fatigue, nausea, shortness of breath and vomiting   Associated symptoms: no abdominal pain, no cough, no fever, no headache and no palpitations    This is a 62yo AAM with PMH DM, HTN, GERD, HLD who presents with c/o a short episode of nonradiating chest "heaviness" at 8AM this morning when he was sitting down at his PCPs office. The chest pressure was "mild" and went away on its own after 1 minute. After an hour of having no chest pain, he had another identical episode of CP that lasted 1 minute and resolved on its own. Patient received 1 NTG and ASA en route to ED. During the episode patient endorses feeling SOB, lightheaded and feeling very sleepy. He denies diaphoresis. He was slightly nauseated, but this is not different as he was being seen by PCP for N/V/D that he has had for 2 days. Patient denies hx of similar prior episodes of chest pain. He endorses that over the last 2 weeks he has DOE and recently has 2 pillow orthopnea as well as describes episodes of PND. No BLE edema. Denies recent injury to chest or new/different activity.    Of note, patient has had N/V/D x 2 days. Approx 4 episodes of watery, nonbloody diarrhea per day and 2 episodes of nonbloody emesis per day, no abd pain. His symptoms are improving. He also endorses having polyuria and polydipsia over the last few weeks. His DM had been managed w/ metformin until about 6 months ago when his PCP stopped this medication because his DM was well controlled.   Past Medical History  Diagnosis Date  .  Diabetes mellitus without complication   . Hypertension    No past surgical history on file. No family history on file. History  Substance Use Topics  . Smoking status: Never Smoker   . Smokeless tobacco: Not on file  . Alcohol Use: No    Review of Systems  Constitutional: Positive for fatigue. Negative for fever and chills.  Respiratory: Positive for shortness of breath. Negative for cough.   Cardiovascular: Positive for chest pain. Negative for palpitations and leg swelling.  Gastrointestinal: Positive for nausea, vomiting and diarrhea. Negative for abdominal pain and blood in stool.  Endocrine: Positive for polydipsia and polyuria.  Genitourinary: Negative for dysuria.  Neurological: Positive for light-headedness. Negative for syncope and headaches.  All other systems reviewed and are negative.      Allergies  Peanut-containing drug products  Home Medications   Current Outpatient Rx  Name  Route  Sig  Dispense  Refill  . aspirin EC 325 MG tablet   Oral   Take 325 mg by mouth daily.         Marland Kitchen. esomeprazole (NEXIUM) 40 MG capsule   Oral   Take 40 mg by mouth daily before breakfast.         . HYDROcodone-acetaminophen (NORCO/VICODIN) 5-325 MG per tablet   Oral   Take 1 tablet by mouth every 4 (four) hours as needed for pain.   15 tablet   0   . ibuprofen (ADVIL,MOTRIN) 600  MG tablet   Oral   Take 1 tablet (600 mg total) by mouth every 8 (eight) hours as needed for pain.   15 tablet   0   . metFORMIN (GLUCOPHAGE) 1000 MG tablet   Oral   Take 1,000 mg by mouth 2 (two) times daily with a meal.          BP 120/65  Pulse 65  Temp(Src) 98.1 F (36.7 C) (Oral)  Resp 18  SpO2 97% Physical Exam  Constitutional: He is oriented to person, place, and time. He appears well-developed and well-nourished. No distress.  HENT:  Head: Normocephalic and atraumatic.  Dry mucous membranes  Eyes: Conjunctivae are normal. Pupils are equal, round, and reactive to  light.  Neck: Neck supple.  Cardiovascular: Normal rate, regular rhythm and normal heart sounds.  Exam reveals no gallop and no friction rub.   No murmur heard. Pulmonary/Chest: Effort normal and breath sounds normal. No respiratory distress.  Abdominal: Soft. Bowel sounds are normal. There is no tenderness.  Musculoskeletal: He exhibits no edema.  Neurological: He is alert and oriented to person, place, and time. No cranial nerve deficit.  Skin: Skin is warm and dry. He is not diaphoretic.    ED Course  Procedures (including critical care time) Labs Review Labs Reviewed  CBG MONITORING, ED - Abnormal; Notable for the following:    Glucose-Capillary 400 (*)    All other components within normal limits  CBC WITH DIFFERENTIAL  BASIC METABOLIC PANEL  I-STAT TROPOININ, ED   Imaging Review No results found.   EKG Interpretation   Date/Time:  Friday June 15 2013 09:45:53 EDT Ventricular Rate:  67 PR Interval:  178 QRS Duration: 90 QT Interval:  452 QTC Calculation: 477 R Axis:   28 Text Interpretation:  Sinus rhythm Borderline T abnormalities, lateral  leads ST elevation, consider inferior injury Borderline prolonged QT  interval Confirmed by Freida Busman  MD, ANTHONY ([id number]) on 06/15/2013 9:50:14 AM      MDM   This is a 62yo AAM w/ PMH DM, HTN, HLD who presents after having two short episodes of chest heaviness this morning. Given his RFs and that he had some associated symptoms typical of ACS with the episodes of chest pain, I am most concerned for ACS. First troponin negative. EKG shows some T wave flattening in V4-V6, but no clear ischemic changes (no prior EKG for comparison). CXR clear. Patient no longer experiencing chest pain. Await CBC, BMP, UA. Will likely require admission for ACS rule out. I suspect patient will also need to be put back on some kind of hypoglycemic agent as his CBG is 400 today and he admits to polyuria and polydipsia recently.   12:42 PM Patient is  feeling well without any more chest pain. UA shows +nitrites, >80 ketones, many WBC. I went back to discuss this further with patient and he is only c/o increased urinary frequency, no dysuria, hematuria, abd pain. Will begin treatment with rocephin 1g. Patient also has an AG in the setting of hyperglycemia and ketones in his UA. Possibly early DKA. Patient received 500cc NS earlier and I'll give another 1L NS now. Will call for admission for ACS r/o and treatment of UTI.  1:15 PM Spoke with IMTS. They agree to admit patient.  Windell Hummingbird, MD 06/15/13 1316

## 2013-06-15 NOTE — ED Notes (Signed)
Urinal given and encouraged to use for sample.

## 2013-06-15 NOTE — ED Notes (Signed)
CBG 400 

## 2013-06-16 DIAGNOSIS — R5381 Other malaise: Secondary | ICD-10-CM | POA: Diagnosis not present

## 2013-06-16 DIAGNOSIS — R112 Nausea with vomiting, unspecified: Secondary | ICD-10-CM | POA: Diagnosis not present

## 2013-06-16 DIAGNOSIS — R0789 Other chest pain: Secondary | ICD-10-CM | POA: Diagnosis not present

## 2013-06-16 DIAGNOSIS — R5383 Other fatigue: Secondary | ICD-10-CM | POA: Diagnosis not present

## 2013-06-16 DIAGNOSIS — N390 Urinary tract infection, site not specified: Secondary | ICD-10-CM | POA: Diagnosis not present

## 2013-06-16 DIAGNOSIS — R079 Chest pain, unspecified: Secondary | ICD-10-CM

## 2013-06-16 LAB — CBC
HCT: 42.3 % (ref 39.0–52.0)
HEMOGLOBIN: 14.7 g/dL (ref 13.0–17.0)
MCH: 30.8 pg (ref 26.0–34.0)
MCHC: 34.8 g/dL (ref 30.0–36.0)
MCV: 88.5 fL (ref 78.0–100.0)
Platelets: 352 10*3/uL (ref 150–400)
RBC: 4.78 MIL/uL (ref 4.22–5.81)
RDW: 13 % (ref 11.5–15.5)
WBC: 5.1 10*3/uL (ref 4.0–10.5)

## 2013-06-16 LAB — BASIC METABOLIC PANEL
BUN: 10 mg/dL (ref 6–23)
CO2: 20 mEq/L (ref 19–32)
Calcium: 8.3 mg/dL — ABNORMAL LOW (ref 8.4–10.5)
Chloride: 102 mEq/L (ref 96–112)
Creatinine, Ser: 0.91 mg/dL (ref 0.50–1.35)
GFR calc Af Amer: >90 mL/min (ref >90)
GFR calc non Af Amer: 90 mL/min — ABNORMAL LOW (ref >90)
GLUCOSE: 274 mg/dL — AB (ref 70–99)
POTASSIUM: 4.5 meq/L (ref 3.7–5.3)
SODIUM: 138 meq/L (ref 137–147)

## 2013-06-16 LAB — HEPATIC FUNCTION PANEL
ALK PHOS: 77 U/L (ref 39–117)
ALT: 26 U/L (ref 0–53)
AST: 25 U/L (ref 0–37)
Albumin: 3.1 g/dL — ABNORMAL LOW (ref 3.5–5.2)
TOTAL PROTEIN: 6 g/dL (ref 6.0–8.3)
Total Bilirubin: 0.5 mg/dL (ref 0.3–1.2)

## 2013-06-16 LAB — GLUCOSE, CAPILLARY
GLUCOSE-CAPILLARY: 289 mg/dL — AB (ref 70–99)
Glucose-Capillary: 273 mg/dL — ABNORMAL HIGH (ref 70–99)

## 2013-06-16 LAB — TROPONIN I
Troponin I: <0.3 ng/mL (ref <0.30)
Troponin I: <0.3 ng/mL (ref <0.30)

## 2013-06-16 LAB — HEMOGLOBIN A1C
HEMOGLOBIN A1C: 10.8 % — AB (ref <5.7)
Mean Plasma Glucose: 263 mg/dL — ABNORMAL HIGH (ref <117)

## 2013-06-16 MED ORDER — CIPROFLOXACIN HCL 500 MG PO TABS
500.0000 mg | ORAL_TABLET | Freq: Two times a day (BID) | ORAL | Status: DC
  Administered 2013-06-16: 500 mg via ORAL
  Filled 2013-06-16 (×3): qty 1

## 2013-06-16 MED ORDER — METFORMIN HCL 500 MG PO TABS: 500.0000 mg | ORAL_TABLET | Freq: Two times a day (BID) | ORAL | Status: DC

## 2013-06-16 MED ORDER — CIPROFLOXACIN HCL 500 MG PO TABS: 500.0000 mg | ORAL_TABLET | Freq: Two times a day (BID) | ORAL | Status: DC

## 2013-06-16 MED ORDER — INSULIN GLARGINE 100 UNIT/ML ~~LOC~~ SOLN
15.0000 [IU] | Freq: Every day | SUBCUTANEOUS | Status: DC
Start: 2013-06-16 — End: 2013-06-16
  Filled 2013-06-16: qty 0.15

## 2013-06-16 MED ORDER — DEXTROSE 5 % IV SOLN: 1.0000 g | Freq: Once | INTRAVENOUS | Status: DC

## 2013-06-16 MED ORDER — INSULIN GLARGINE 100 UNIT/ML ~~LOC~~ SOLN
15.0000 [IU] | Freq: Every day | SUBCUTANEOUS | Status: DC
  Filled 2013-06-16: qty 0.15

## 2013-06-16 NOTE — Consult Note (Signed)
Primary Physician: Primary Cardiologist:   HPI: Jeremy Farmer is a 63 yo who we are asked to see re CP  Patient has a history of DM, HTN, GERD, HL  [facility] with short episode of heaviness.  Occurred at rest.  Lasted 1 min.  Had another spell lasting 1 min.  EMS called from  Clinic. Patient does note increase thirst, urination x 1 wk  Increased DOE and also weakness x 1 wk.   Patient had diarrhea x 2 days.  And 2 episodes of vomiting In ER patient appeard dry yesterday  IV fluids given and antibiotics started.   [facility]  Stress test 2010 was negative for ischemia  Echo showed LVEF 55 to 60%  While here in the hospital he has had no chest discomfort.  Has walked some without problem  Still urinating a lot.      Past Medical History  Diagnosis Date  . Hypertension   . High cholesterol   . Type II diabetes mellitus   . GERD (gastroesophageal reflux disease)   . Stroke 2009    "mini stroke; denies residual on 06/15/2013)    Medications Prior to Admission  Medication Sig Dispense Refill  . amLODipine (NORVASC) 5 MG tablet Take 5 mg by mouth every morning.      Marland Kitchen. aspirin EC 81 MG tablet Take 81 mg by mouth every morning.      . carvedilol (COREG) 25 MG tablet Take 25 mg by mouth 2 (two) times daily.      Marland Kitchen. esomeprazole (NEXIUM) 40 MG capsule Take 40 mg by mouth daily before breakfast.      . triamcinolone cream (KENALOG) 0.1 % Apply 1 application topically daily as needed. For rash      . CRESTOR 10 MG tablet Take 10 mg by mouth at bedtime.         Marland Kitchen. amLODipine  5 mg Oral Daily  . aspirin EC  81 mg Oral q morning - 10a  . atorvastatin  20 mg Oral q1800  . carvedilol  25 mg Oral BID  . ciprofloxacin  500 mg Oral BID  . heparin  5,000 Units Subcutaneous 3 times per day  . insulin aspart  0-9 Units Subcutaneous TID WC & HS  . pantoprazole  40 mg Oral BID  . pneumococcal 23 valent vaccine  0.5 mL Intramuscular Tomorrow-1000    Infusions: .  sodium chloride 150 mL/hr at 06/16/13 0842    Allergies  Allergen Reactions  . Peanut-Containing Drug Products Anaphylaxis and Swelling    And peanut oil    History   Social History  . Marital Status: Married    Spouse Name: N/A    Number of Children: N/A  . Years of Education: N/A   Occupational History  . Not on file.   Social History Main Topics  . Smoking status: Never Smoker   . Smokeless tobacco: Never Used  . Alcohol Use: No  . Drug Use: No  . Sexual Activity: No   Other Topics Concern  . Not on file   Social History Narrative  . No narrative on file    History reviewed. No pertinent family history.  REVIEW OF SYSTEMS:  All systems reviewed  Negative to the above problem except as noted above.    PHYSICAL EXAM: Filed Vitals:   06/16/13 0605  BP: 133/73  Pulse: 61  Temp: 98.1 F (36.7 C)  Resp: 18  Intake/Output Summary (Last 24 hours) at 06/16/13 1126 Last data filed at 06/16/13 1100  Gross per 24 hour  Intake   1700 ml  Output   2225 ml  Net   -525 ml    General:  Well appearing. No respiratory difficulty HEENT: normal Neck: supple. no JVD. Carotids 2+ bilat; no bruits. No lymphadenopathy or thryomegaly appreciated. Cor: PMI nondisplaced. Regular rate & rhythm. No rubs, gallops or murmurs. Lungs: clear Abdomen: soft.  RUQ tenderness No rebound  No hepatosplenomegaly. No bruits or masses. Good bowel sounds. Extremities: no cyanosis, clubbing, rash, edema Neuro: alert & oriented x 3, cranial nerves grossly intact. moves all 4 extremities w/o difficulty. Affect pleasant.  ECG: 3/13:  SR 67  Motion  Nonspecific ST chagnes  3/13 at 1500  SR  T wave inversion in anterolateral leads  Results for orders placed during the hospital encounter of 06/15/13 (from the past 24 hour(s))  CBG MONITORING, ED     Status: Abnormal   Collection Time    06/15/13  2:04 PM      Result Value Ref Range   Glucose-Capillary 343 (*) 70 - 99 mg/dL   Comment 1  Notify RN     Comment 2 Documented in Chart    GLUCOSE, CAPILLARY     Status: Abnormal   Collection Time    06/15/13  4:33 PM      Result Value Ref Range   Glucose-Capillary 356 (*) 70 - 99 mg/dL  TROPONIN I     Status: None   Collection Time    06/15/13  6:00 PM      Result Value Ref Range   Troponin I <0.30  <0.30 ng/mL  GLUCOSE, CAPILLARY     Status: Abnormal   Collection Time    06/15/13  8:59 PM      Result Value Ref Range   Glucose-Capillary 274 (*) 70 - 99 mg/dL  TROPONIN I     Status: None   Collection Time    06/16/13  2:05 AM      Result Value Ref Range   Troponin I <0.30  <0.30 ng/mL  TROPONIN I     Status: None   Collection Time    06/16/13  6:44 AM      Result Value Ref Range   Troponin I <0.30  <0.30 ng/mL  BASIC METABOLIC PANEL     Status: Abnormal   Collection Time    06/16/13  6:44 AM      Result Value Ref Range   Sodium 138  137 - 147 mEq/L   Potassium 4.5  3.7 - 5.3 mEq/L   Chloride 102  96 - 112 mEq/L   CO2 20  19 - 32 mEq/L   Glucose, Bld 274 (*) 70 - 99 mg/dL   BUN 10  6 - 23 mg/dL   Creatinine, Ser 8.11  0.50 - 1.35 mg/dL   Calcium 8.3 (*) 8.4 - 10.5 mg/dL   GFR calc non Af Amer 90 (*) >90 mL/min   GFR calc Af Amer >90  >90 mL/min  CBC     Status: None   Collection Time    06/16/13  6:44 AM      Result Value Ref Range   WBC 5.1  4.0 - 10.5 K/uL   RBC 4.78  4.22 - 5.81 MIL/uL   Hemoglobin 14.7  13.0 - 17.0 g/dL   HCT 91.4  78.2 - 95.6 %   MCV 88.5  78.0 - 100.0  fL   MCH 30.8  26.0 - 34.0 pg   MCHC 34.8  30.0 - 36.0 g/dL   RDW 40.9  81.1 - 91.4 %   Platelets 352  150 - 400 K/uL  GLUCOSE, CAPILLARY     Status: Abnormal   Collection Time    06/16/13  7:59 AM      Result Value Ref Range   Glucose-Capillary 273 (*) 70 - 99 mg/dL   Dg Chest 2 View  7/82/9562   CLINICAL DATA:  Mid chest pain, hypertension, dizziness  EXAM: CHEST  2 VIEW  COMPARISON:  05/29/2012  FINDINGS: The heart size and mediastinal contours are within normal limits.  Both lungs are clear. The visualized skeletal structures are unremarkable.  IMPRESSION: No active cardiopulmonary disease.   Electronically Signed   By: Ruel Favors M.D.   On: 06/15/2013 10:54     ASSESSMENT:  Patient is a 63 yo who presents with N/V, diarrhea, dehydraton and chest pressure/pain.  Pain is atypical  Now gone.   Exam is remarkable form RUQ tenderness.   EKGs are nonspecific Labs signf for hyperglycemia. Hgb A1C is 10  I am not convinced patients symptoms yesterday reflect coronary ischemia.   May have been GI   I would follow clinically  Have patient ambulate.   I would, with RUQ symptoms check LFTs.   If negative and patient without discomfort I will make sure he has f/u as outpatient for possible stress testing.  2.  DM  Not controlled  Plan per primary service  3.  HL  Keep on statin.    Dietrich Pates

## 2013-06-16 NOTE — Discharge Instructions (Signed)
It was a pleasure taking care of you. - You chest pain was most likely not due to a heart attack. We did a blood test for heart attack (troponin) which was negative. - Your diabetes is not well controlled. This is likely why you were drinking so much and urinating so much. Your A1C, which is a long term measure of sugar control, is 10.8. It used to be 6.0 in December. Please see the attached information on diabetic diet and exercise planning. - Please follow up with your PCP on Monday as scheduled above to discuss your diabetes. - I have prescribed you Metformin to start over the weekend. You have been on this medicine before. The most common side effect is upset stomach. Your doctor may also want to start you on Insulin, but we will leave this to her discretion. - You also have a mild urine infection. Please take your antibiotic, Ciprofloxacin, as prescribed above. - If you develop dizziness, shortness of breath, recurrent chest pain, please return to the ED.

## 2013-06-16 NOTE — H&P (Signed)
Internal Medicine Attending Admission Note Date: 06/16/2013  Patient name: Jeremy Farmer Medical record number: [id number] Date of birth: [birth year] Age: 63 y.o. Gender: male  I saw and evaluated the patient. I reviewed the resident's note and I agree with the resident's findings and plan as documented in the resident's note, with the following additional comments.  Chief Complaint(s): Chest pain  History - key components related to admission: Patient is a 63 year old man with history of type 2 diabetes mellitus but off of medications for about 6 months because of normal A1c, hypertension, GERD, hyperlipidemia, gout, and other problems as outlined in the medical history, admitted with several episodes of midsternal chest pain/heaviness which occurred yesterday morning; each episode lasted for around 1 minute, and then was followed by a feeling of tightness in his chest which persisted longer.  He reports resolution of the discomfort after being given aspirin and sublingual nitroglycerin by EMS.  He has no pain today.  Patient also reports a two-week history of polydipsia and polyuria.  He has not been checking his blood sugars at home recently.  Physical Exam - key components related to admission:  Filed Vitals:   06/15/13 1400 06/15/13 1430 06/15/13 2102 06/16/13 0605  BP: 141/96 113/66 123/69 133/73  Pulse: 64 54 67 61  Temp:  97.5 F (36.4 C) 98 F (36.7 C) 98.1 F (36.7 C)  TempSrc:  Oral Oral Oral  Resp: 16 16 18 18   SpO2: 96% 98% 95% 95%   General: Alert, no distress Lungs: Clear Heart: Regular; no extra sounds or murmurs Abdomen: Bowel sounds present, soft, nontender Extremities: No edema  Lab results:   Basic Metabolic Panel:  Recent Labs  78/29/5603/13/15 1012 06/16/13 0644  NA 134* 138  K 4.7 4.5  CL 94* 102  CO2 22 20  GLUCOSE 431* 274*  BUN 11 10  CREATININE 1.09 0.91  CALCIUM 9.0 8.3*     CBC:  Recent Labs  06/15/13 1012 06/16/13 0644  WBC 6.6 5.1  HGB  15.5 14.7  HCT 43.1 42.3  MCV 88.0 88.5  PLT 404* 352    Recent Labs  06/15/13 1012  NEUTROABS 3.4  LYMPHSABS 2.5  MONOABS 0.5  EOSABS 0.1  BASOSABS 0.1    Cardiac Enzymes:  Recent Labs  06/15/13 1800 06/16/13 0205 06/16/13 0644  TROPONINI <0.30 <0.30 <0.30    CBG:  Recent Labs  06/15/13 1015 06/15/13 1404 06/15/13 1633 06/15/13 2059 06/16/13 0759 06/16/13 1148  GLUCAP 400* 343* 356* 274* 273* 289*    Hemoglobin A1C:  Recent Labs  06/15/13 1012  HGBA1C 10.8*    Urinalysis    Component Value Date/Time   COLORURINE YELLOW 06/15/2013 1103   APPEARANCEUR CLOUDY* 06/15/2013 1103   LABSPEC 1.039* 06/15/2013 1103   PHURINE 5.0 06/15/2013 1103   GLUCOSEU >1000* 06/15/2013 1103   HGBUR TRACE* 06/15/2013 1103   BILIRUBINUR NEGATIVE 06/15/2013 1103   KETONESUR >80* 06/15/2013 1103   PROTEINUR NEGATIVE 06/15/2013 1103   UROBILINOGEN 0.2 06/15/2013 1103   NITRITE POSITIVE* 06/15/2013 1103   LEUKOCYTESUR NEGATIVE 06/15/2013 1103    Urine microscopic:  Recent Labs  06/15/13 1103  EPIU RARE  WBCU TOO NUMEROUS TO COUNT  RBCU 0-2  BACTERIA FEW*    Imaging results:  Dg Chest 2 View  06/15/2013   CLINICAL DATA:  Mid chest pain, hypertension, dizziness  EXAM: CHEST  2 VIEW  COMPARISON:  05/29/2012  FINDINGS: The heart size and mediastinal contours are within normal limits. Both lungs are  clear. The visualized skeletal structures are unremarkable.  IMPRESSION: No active cardiopulmonary disease.   Electronically Signed   By: Ruel Favors M.D.   On: 06/15/2013 10:54    Other results: EKG: Normal sinus rhythm; T wave abnormality, consider lateral ischemia  Assessment & Plan by Problem:  1.  Chest pain.  Patient presented with complaint of brief episodes of intermittent chest pain/heaviness which subsequently resolved.  Risk factors for coronary artery disease include uncontrolled diabetes mellitus, hyperlipidemia, and hypertension as well as male gender and age.   There is no evidence of acute MI by cardiac enzymes.  Cardiology was consulted, and Dr. Tenny Craw saw patient; she does not plan further inpatient workup if the patient has no recurrence of chest pain with ambulation.  She will arrange outpatient cardiology followup for possible stress testing.  Would continue daily aspirin and management of his other risk factors.  2.  Uncontrolled diabetes mellitus.  Patient was previously on medications for his diabetes, and reports that these were stopped about 6 months ago because he had good control; he also reportedly had a hemoglobin A1c in December of last year below 7.  However, he now has symptomatic hyperglycemia and an elevated hemoglobin A1c.  Plan is Lantus plus sliding scale insulin in the inpatient setting.  He will need to resume an outpatient regimen for his diabetes and he will need close follow up by his primary care physician.  3.  Urinary tract infection.  Plan is treat empirically with ciprofloxacin pending urine culture results.  4.  Other problems and plans as per the resident physician's note.

## 2013-06-16 NOTE — Discharge Summary (Signed)
Name: Jeremy Farmer MRN: [id number] DOB: Feb 12, 1951 63 y.o. PCP: Jeremy Ora, MD  Date of Admission: 06/15/2013  9:42 AM Date of Discharge: 06/16/2013 Attending Physician: Jeremy Ly, MD  Discharge Diagnosis: 1. Atypical chest pain, rule out MI 2. UTI 3. DM2 with hyperglycemia 4. Nausea, vomiting, diarrhea x2 days prior to presentation, resolved 5. Elevated anion gap 6. HTN 9. HLD  Discharge Medications:   Medication List         amLODipine 5 MG tablet  Commonly known as:  NORVASC  Take 5 mg by mouth every morning.     aspirin EC 81 MG tablet  Take 81 mg by mouth every morning.     carvedilol 25 MG tablet  Commonly known as:  COREG  Take 25 mg by mouth 2 (two) times daily.     ciprofloxacin 500 MG tablet  Commonly known as:  CIPRO  Take 1 tablet (500 mg total) by mouth 2 (two) times daily.     CRESTOR 10 MG tablet  Generic drug:  rosuvastatin  Take 10 mg by mouth at bedtime.     esomeprazole 40 MG capsule  Commonly known as:  NEXIUM  Take 40 mg by mouth daily before breakfast.     metFORMIN 500 MG tablet  Commonly known as:  GLUCOPHAGE  Take 1 tablet (500 mg total) by mouth 2 (two) times daily with a meal.     triamcinolone cream 0.1 %  Commonly known as:  KENALOG  Apply 1 application topically daily as needed. For rash        Disposition and follow-up:   [name] was discharged from [facility] in Stable condition.  At the hospital follow up visit please address:  1.  Recurrent chest pressure? Consider cardiology referral and stress test as outpatient. Poor diabetes control (A1C 10.8), please assess CBG control and titrate insulin as needed. I have ordered Metformin for the patient to start over the weekend.  2.  Labs / imaging needed at time of follow-up: BMP, CBC, UA  3.  Pending labs/ test needing follow-up: Urine culture  Follow-up Appointments: Follow-up Information   Follow up with Jeremy L, MD On  06/18/2013. (@1 :30pm. This is your primary care doctor.)    Specialty:  Family Medicine   Contact information:   [address] [place] [address] [phone]       Discharge Instructions: Discharge Orders   Future Orders Complete By Expires   Call MD for:  extreme fatigue  As directed    Call MD for:  persistant dizziness or light-headedness  As directed    Call MD for:  severe uncontrolled pain  As directed    Call MD for:  temperature >100.4  As directed    Diet Carb Modified  As directed    Increase activity slowly  As directed       Consultations:  Cardiology  Procedures Performed:  Dg Chest 2 View  06/15/2013   CLINICAL DATA:  Mid chest pain, hypertension, dizziness  EXAM: CHEST  2 VIEW  COMPARISON:  05/29/2012  FINDINGS: The heart size and mediastinal contours are within normal limits. Both lungs are clear. The visualized skeletal structures are unremarkable.  IMPRESSION: No active cardiopulmonary disease.   Electronically Signed   By: Jeremy Farmer M.D.   On: 06/15/2013 10:54    Admission HPI:  Jeremy Farmer is a 63 y.o. male with PMH DM2, HTN, GERD, HLD, gout who presents  to the emergency room today with a chief complaint of transient episodes of chest pain.   The patient describes a short episode of chest "heaviness" occuring at 8am this morning when he was sitting down at his PCP's office ([facility]). He describes it as a brick sitting on his chest. The sensation went away on its own after 1 minute, and he chalked it off to heartburn. He denies diaphoresis or radiation. After an hour, he had another identical episode of chest pressure that again lasted 1 minute and resolved on its own. At this point EMS was called from the clinic. He says he felt the pressure starting back for a third time, but he was given 1 NTG and ASA en route to ED and this treated it. He denies any episodes of chest pressure since presentation.   This sensation has never  happened before. He denies recent injury to chest or new/different activity. He does say that over the last 2 weeks he has noted polyuria, polydipsia, dyspnea on exertion, and generalized weakness. He has DM2, diagnosed in 2009 and formerly on insulin and metformin, but it has been so well controlled that his PCP stopped all therapies about 6 months ago. Last A1C was 6.0 on 03/16/2013 per [facility]where.   He also reports 2 pillow orthopnea, but this has been stable over the past few years. Denies leg swelling or weight gain (he says he has actually lost weight). He does not have a history of heart failure. He tells us he thinks he had a normal 2D echo last year.   Of note, patient has had N/V/D x 2 days, consisting of approximately 4 episodes of watery, nonbloody diarrhea per day and 2 episodes of nonbloody emesis per day. He denies abdominal pain and says his symptoms are improving. He admits he has had no appetite during this period. Denies new foods or eating out at a new place. No sick contacts.  His wife is from [place] and they spend half the year there. He recently got back. He tells us he eats mostly fruits and vegetables when he is living there, and his wife "makes my medicines from roots and berries." He confirms that he does get his medicine from a pharmacy, and take it, when he is living in the [place].   I was able to speak with his PCP on the phone (Dr. Duanne Farmer). She does not recall a significant cardiac history other than hypertension. The office was able to send me old records of an exercise stress test and 2D echo [facility] in [place], [place] in 03/2009. Stress test was negative for ischemia to 87% predicted heart rate, 2D echo report shows EF 55-60%, normal wall thickness and systolic function. No other recent cardiac studies.  He denies tobacco, alcohol, illicit drug use.   In the ED, he was given IV ceftriaxone, a 500 cc normal saline fluid bolus, followed by 1 Farmer normal  saline fluid bolus.   Physical Exam:  Blood pressure 113/66, pulse 54, temperature 97.5 F (36.4 C), temperature source Oral, resp. rate 16, SpO2 98.00%.  Physical Exam  Constitutional: He is oriented to person, place, and time and well-developed, well-nourished, and in no distress.  HENT:  Head: Normocephalic and atraumatic.  Mouth/Throat: Mucous membranes are dry.  Eyes: Conjunctivae and EOM are normal. Pupils are equal, round, and reactive to light.  Neck: Normal range of motion. Neck supple.  Cardiovascular: Normal rate, regular rhythm, normal heart sounds and  intact distal pulses. Exam reveals no gallop and no friction rub.  No murmur heard.  Pulmonary/Chest: Effort normal and breath sounds normal. No respiratory distress. He has no wheezes. He has no rales. He exhibits no tenderness.  Abdominal: Soft. Bowel sounds are normal. He exhibits no distension and no mass. There is no tenderness. There is no rebound and no guarding.  Musculoskeletal: Normal range of motion. He exhibits no edema and no tenderness.  Neurological: He is alert and oriented to person, place, and time. No cranial nerve deficit. GCS score is 15.  Skin: Skin is warm and dry. He is not diaphoretic.  Psychiatric: Affect normal.    Hospital Course by problem list: Jeremy Farmer is a 63 y.o. male with PMH DM2, HTN, GERD, HLD, gout who presents to the emergency room today with a chief complaint of transient episodes of chest pain.   1. Atypical chest pain, rule out MI - Patient reports 2-3 transient episodes of chest pressure lasting less than 1 minute each, nonradiating, responsive to nitroglycerin. EKG showed nonspecific T wave inversions in the lateral precordial leads (we did not have access to a prior EKG). He has no history of CAD, but risk factors include diabetes, hypertension, hyperlipidemia. TIMI score 2 (for risk factors, ASA use). He had no chest pain overnight. Troponins were trended and negative x3. Repeat  EKG still showed TWI in lateral precordial leads. Prior to discharge we had a cardiologist evaluate him, and she was not convinced patient's symptoms reflected coronary ischemia. She suspected a GI etiology. Close PCP follow up was arranged. We recommend considering outpatient cardiology referral and/or stress test.  2. UTI - Patient endorsed polyuria on review of systems. Denied dysuria. UA showed positive nitrites, too numerous to count white blood cells, rare squams, few bacteria. He got IV ceftriaxone in ED. He did not have fever or leukocytosis. He told us he has a history of UTI a few years ago. We transitioned him to Cipro 500mg  po BID x 7 days. Please follow up urine cultures.  3. DM2 with hyperglycemia - Glucose elevated to 400 on admission, and he reports polyuria/polydipsia x2 weeks. A1c 10.8. Takes no oral hypoglycemics or insulin at home due to prior excellent control (A1C 6.0 in 03/2013). Poor dietary habits at home per wife since they moved back from [place], where he was eating fruits, vegetables, and exercising. Trigger may be infection (UTI) vs. Dietary noncompliance. We did CBG checks q4H and provided SSI. We also gave Lantus 15 units QHS x1. Patient was discharged on Metformin and instructed to follow up with his PCP on Monday for diabetes-focused visit. He was provided literature on carb-modified diet and exercise in diabetes.  Glucose-Capillary  Date Value Ref Range Status  06/16/2013 289* 70 - 99 mg/dL Final  1/61/0960 454* 70 - 99 mg/dL Final  0/98/1191 478* 70 - 99 mg/dL Final  2/95/6213 086* 70 - 99 mg/dL Final  5/78/4696 295* 70 - 99 mg/dL Final   4. Nausea, vomiting, diarrhea x2 days prior to presentation, resolved - No episodes since presentation. He reported anorexia x2 days, but felt better on admission and requested a diet. He was noted to have some RUQ tenderness, but there were no LFT abnormalities. We provided a carb modified diet. Suspect gastroenteritis vs.  sequelae of UTI.  Hepatic Function Panel     Component Value Date/Time   PROT 6.0 06/16/2013 0644   ALBUMIN 3.1* 06/16/2013 0644   AST 25 06/16/2013 0644  ALT 26 06/16/2013 0644   ALKPHOS 77 06/16/2013 0644   BILITOT 0.5 06/16/2013 0644   BILIDIR <0.2 06/16/2013 0644   IBILI NOT CALCULATED 06/16/2013 0644    5. Elevated anion gap - On admission anion gap 18, delta gap 6, delta ratio 3 suggesting AG acidosis and a concurrent metabolic alkalosis. Chloride was low and bicarbonate was normal. He had ketones in his urine. Likely starvation ketoacidosis combined with metabolic alkalosis secondary to GI losses from vomiting, but we also considered DKA. His gap down trended to 16 with IVF resuscitation (NS 2.5L followed by NS @150cc /hr x 12 hours). Please check BMP as outpatient.  6. Suspect sleep apnea - Patient reported SOB at night for years. He has no signs of volume overload on exam. 2D echo was normal in 2010. He has a large body habitus. There is mention in his clinic notes of concern for sleep apnea. Recommend sleep study as outpatient if it has not already been done   7. HTN - Normotensive here. We continued home amlodipine 5 mg daily,  home Coreg 25 mg twice a day.  8. HLD - Continued statin.   Discharge Vitals:   BP 134/72  Pulse 61  Temp(Src) 97.9 F (36.6 C) (Oral)  Resp 18  SpO2 95%  Discharge Labs:  Results for orders placed during the hospital encounter of 06/15/13 (from the past 24 hour(s))  GLUCOSE, CAPILLARY     Status: Abnormal   Collection Time    06/15/13  4:33 PM      Result Value Ref Range   Glucose-Capillary 356 (*) 70 - 99 mg/dL  TROPONIN I     Status: None   Collection Time    06/15/13  6:00 PM      Result Value Ref Range   Troponin I <0.30  <0.30 ng/mL  GLUCOSE, CAPILLARY     Status: Abnormal   Collection Time    06/15/13  8:59 PM      Result Value Ref Range   Glucose-Capillary 274 (*) 70 - 99 mg/dL  TROPONIN I     Status: None   Collection Time     06/16/13  2:05 AM      Result Value Ref Range   Troponin I <0.30  <0.30 ng/mL  TROPONIN I     Status: None   Collection Time    06/16/13  6:44 AM      Result Value Ref Range   Troponin I <0.30  <0.30 ng/mL  BASIC METABOLIC PANEL     Status: Abnormal   Collection Time    06/16/13  6:44 AM      Result Value Ref Range   Sodium 138  137 - 147 mEq/Farmer   Potassium 4.5  3.7 - 5.3 mEq/Farmer   Chloride 102  96 - 112 mEq/Farmer   CO2 20  19 - 32 mEq/Farmer   Glucose, Bld 274 (*) 70 - 99 mg/dL   BUN 10  6 - 23 mg/dL   Creatinine, Ser 1.61  0.50 - 1.35 mg/dL   Calcium 8.3 (*) 8.4 - 10.5 mg/dL   GFR calc non Af Amer 90 (*) >90 mL/min   GFR calc Af Amer >90  >90 mL/min  CBC     Status: None   Collection Time    06/16/13  6:44 AM      Result Value Ref Range   WBC 5.1  4.0 - 10.5 K/uL   RBC 4.78  4.22 - 5.81 MIL/uL  Hemoglobin 14.7  13.0 - 17.0 g/dL   HCT 16.1  09.6 - 04.5 %   MCV 88.5  78.0 - 100.0 fL   MCH 30.8  26.0 - 34.0 pg   MCHC 34.8  30.0 - 36.0 g/dL   RDW 40.9  81.1 - 91.4 %   Platelets 352  150 - 400 K/uL  HEPATIC FUNCTION PANEL     Status: Abnormal   Collection Time    06/16/13  6:44 AM      Result Value Ref Range   Total Protein 6.0  6.0 - 8.3 g/dL   Albumin 3.1 (*) 3.5 - 5.2 g/dL   AST 25  0 - 37 U/Farmer   ALT 26  0 - 53 U/Farmer   Alkaline Phosphatase 77  39 - 117 U/Farmer   Total Bilirubin 0.5  0.3 - 1.2 mg/dL   Bilirubin, Direct <7.8  0.0 - 0.3 mg/dL   Indirect Bilirubin NOT CALCULATED  0.3 - 0.9 mg/dL  GLUCOSE, CAPILLARY     Status: Abnormal   Collection Time    06/16/13  7:59 AM      Result Value Ref Range   Glucose-Capillary 273 (*) 70 - 99 mg/dL  GLUCOSE, CAPILLARY     Status: Abnormal   Collection Time    06/16/13 11:48 AM      Result Value Ref Range   Glucose-Capillary 289 (*) 70 - 99 mg/dL    Signed: Vivi Barrack, MD 06/16/2013, 2:06 PM   Time Spent on Discharge: 35 minutes Services Ordered on Discharge: None Equipment Ordered on Discharge: None

## 2013-06-16 NOTE — Progress Notes (Signed)
Utilization review completed.  P.J. Jazae Gandolfi,RN,BSN Case Manager [phone]  

## 2013-06-16 NOTE — Progress Notes (Signed)
Subjective: Patient seen and examined at the bedside this morning. He says he feels a lot better. Denies nausea, vomiting, diarrhea but does continue to endorse polyuria/polydipsia. Tolerated a full liquid diet. No episodes of chest pain overnight. His wife is in the room and is concerned about his diet, saying that since moving from [place] he eats fatty foods and meats and no vegetables.  Objective: Vital signs in last 24 hours: Filed Vitals:   06/15/13 1400 06/15/13 1430 06/15/13 2102 06/16/13 0605  BP: 141/96 113/66 123/69 133/73  Pulse: 64 54 67 61  Temp:  97.5 F (36.4 C) 98 F (36.7 C) 98.1 F (36.7 C)  TempSrc:  Oral Oral Oral  Resp: 16 16 18 18   SpO2: 96% 98% 95% 95%   Weight change:   Intake/Output Summary (Last 24 hours) at 06/16/13 1227 Last data filed at 06/16/13 1100  Gross per 24 hour  Intake   1700 ml  Output   2475 ml  Net   -775 ml   Physical Exam  Constitutional: He is oriented to person, place, and time and well-developed, well-nourished, and in no distress.  HENT:  Head: Normocephalic and atraumatic.  Mouth/Throat: Mucous membranes are moist. Eyes: Conjunctivae and EOM are normal. Pupils are equal, round, and reactive to light.  Neck: Normal range of motion. Neck supple.  Cardiovascular: Normal rate, regular rhythm, normal heart sounds and intact distal pulses. Exam reveals no gallop and no friction rub.  No murmur heard.  Pulmonary/Chest: Effort normal and breath sounds normal. No respiratory distress. He has no wheezes. He has no rales. He exhibits no tenderness.  Abdominal: Soft. Bowel sounds are normal. He exhibits no distension and no mass. There is no tenderness. There is no rebound and no guarding.  Musculoskeletal: Normal range of motion. He exhibits no edema and no tenderness.  Neurological: He is alert and oriented to person, place, and time. No cranial nerve deficit. GCS score is 15.  Skin: Skin is warm and dry. He is not diaphoretic.    Psychiatric: Affect normal.   Lab Results: Basic Metabolic Panel:  Recent Labs Lab 06/15/13 1012 06/16/13 0644  NA 134* 138  K 4.7 4.5  CL 94* 102  CO2 22 20  GLUCOSE 431* 274*  BUN 11 10  CREATININE 1.09 0.91  CALCIUM 9.0 8.3*   Liver Function Tests: No results found for this basename: AST, ALT, ALKPHOS, BILITOT, PROT, ALBUMIN,  in the last 168 hours No results found for this basename: LIPASE, AMYLASE,  in the last 168 hours No results found for this basename: AMMONIA,  in the last 168 hours CBC:  Recent Labs Lab 06/15/13 1012 06/16/13 0644  WBC 6.6 5.1  NEUTROABS 3.4  --   HGB 15.5 14.7  HCT 43.1 42.3  MCV 88.0 88.5  PLT 404* 352   Cardiac Enzymes:  Recent Labs Lab 06/15/13 1800 06/16/13 0205 06/16/13 0644  TROPONINI <0.30 <0.30 <0.30   BNP: No results found for this basename: PROBNP,  in the last 168 hours D-Dimer: No results found for this basename: DDIMER,  in the last 168 hours CBG:  Recent Labs Lab 06/15/13 1015 06/15/13 1404 06/15/13 1633 06/15/13 2059 06/16/13 0759 06/16/13 1148  GLUCAP 400* 343* 356* 274* 273* 289*   Hemoglobin A1C:  Recent Labs Lab 06/15/13 1012  HGBA1C 10.8*   Fasting Lipid Panel: No results found for this basename: CHOL, HDL, LDLCALC, TRIG, CHOLHDL, LDLDIRECT,  in the last 168 hours Thyroid Function Tests: No  results found for this basename: TSH, T4TOTAL, FREET4, T3FREE, THYROIDAB,  in the last 168 hours Coagulation: No results found for this basename: LABPROT, INR,  in the last 168 hours Anemia Panel: No results found for this basename: VITAMINB12, FOLATE, FERRITIN, TIBC, IRON, RETICCTPCT,  in the last 168 hours Urine Drug Screen: Drugs of Abuse  No results found for this basename: labopia,  cocainscrnur,  labbenz,  amphetmu,  thcu,  labbarb    Alcohol Level: No results found for this basename: ETH,  in the last 168 hours Urinalysis:  Recent Labs Lab 06/15/13 1103  COLORURINE YELLOW  LABSPEC 1.039*   PHURINE 5.0  GLUCOSEU >1000*  HGBUR TRACE*  BILIRUBINUR NEGATIVE  KETONESUR >80*  PROTEINUR NEGATIVE  UROBILINOGEN 0.2  NITRITE POSITIVE*  LEUKOCYTESUR NEGATIVE    Micro Results: No results found for this or any previous visit (from the past 240 hour(s)). Studies/Results: Dg Chest 2 View  06/15/2013   CLINICAL DATA:  Mid chest pain, hypertension, dizziness  EXAM: CHEST  2 VIEW  COMPARISON:  05/29/2012  FINDINGS: The heart size and mediastinal contours are within normal limits. Both lungs are clear. The visualized skeletal structures are unremarkable.  IMPRESSION: No active cardiopulmonary disease.   Electronically Signed   By: Ruel Favors M.D.   On: 06/15/2013 10:54   Medications: I have reviewed the patient's current medications. Scheduled Meds: . amLODipine  5 mg Oral Daily  . aspirin EC  81 mg Oral q morning - 10a  . atorvastatin  20 mg Oral q1800  . carvedilol  25 mg Oral BID  . ciprofloxacin  500 mg Oral BID  . heparin  5,000 Units Subcutaneous 3 times per day  . insulin aspart  0-9 Units Subcutaneous TID WC & HS  . insulin glargine  15 Units Subcutaneous QHS  . pantoprazole  40 mg Oral BID   Continuous Infusions: . sodium chloride 150 mL/hr at 06/16/13 0842   PRN Meds:.nitroGLYCERIN  Assessment/Plan: MARCQUIS RIDLON is a 63 y.o. male with PMH DM2, HTN, GERD, HLD, gout who presents to the emergency room today with a chief complaint of transient episodes of chest pain.   #Atypical chest pain, rule out MI - No chest pain overnight. Troponins were negative x3. Repeat EKG still shows TWI in lateral precordial leads. Prior to discharge we will ask cardiology to comment on the need for further workup, given risk factors and concerning description of pain as "brick on chest." - Cardiology consult placed, Dr. Tenny Craw - ASA 81mg  daily  - Disposition pending cardiology's recommendations, possible discharge to home this afternoon  #UTI - Patient endorses polyuria on review of  systems. Denies dysuria. UA shows positive nitrites, too numerous to count white blood cells, rare squams, few bacteria. He got IV ceftriaxone in ED. He does not have fever or leukocytosis. He tells [place] he has a history of UTI a few years ago.  - Transitioning to Cipro 500mg  po BID x 7 dys - Follow up urine cultures   #DM2 with hyperglycemia - A1c 10.8. Takes no oral hypoglycemics or insulin at home due to prior excellent control. Poor dietary habits at home per wife. - Adding Lantus 15 units QHS - SSI, sensitive q4h  - CBG checks q4h  - Will also add back Metformin at discharge - Close PCP follow up has already been arranged  Glucose-Capillary  Date Value Ref Range Status  06/16/2013 289* 70 - 99 mg/dL Final  1/61/0960 454* 70 - 99 mg/dL Final  06/15/2013 274* 70 - 99 mg/dL Final  1/61/09603/13/2015 454356* 70 - 99 mg/dL Final  0/98/11913/13/2015 478343* 70 - 99 mg/dL Final    #Nausea, vomiting, diarrhea, resolving - No episodes since presentation. Anorexia x2 days, but feeling better and now asking to start a liquid diet. He denies dietary changes, but tells us he recently got back from [place].  - D/c stool pathogen panel - Carb modified diet as tolerated   #Mild anion gap acidosis - Anion gap 18>16, resolving with IVF resuscitation. He is s/p NS 2.5L followed by NS @150cc /hr x 12 hours. - BMP in am vs. As outpatient  #Questionable orthopnea? - Patient reports 2 pillow orthopnea for years. There is mention in his clinic notes of concern for sleep apnea. He has no signs of volume overload on exam. 2D echo was normal in 2010, but we have no recent studies.  - Monitor for signs and symptoms of volume overload and workup as indicated  - Recommend sleep study as outpatient if it has not already been done   #HTN - Normotensive currently.  - Continue home amlodipine 5 mg daily  - Continue home Coreg 25 mg twice a day, will need to hold if stress test is ordered  #HLD - Continue statin, Lipitor 20 mg daily    #DVT PPX - Subq heparin   Dispo: Disposition is deferred at this time, awaiting improvement of current medical problems.  Anticipated discharge in approximately 0-2 day(s).   The patient does have a current PCP [facility]([name] L [name], MD) and [facility] after discharge.  The patient does not have transportation limitations that hinder transportation to clinic appointments.  .Services Needed at time of discharge: Y = Yes, Blank = No PT:   OT:   RN:   Equipment:   Other:     LOS: 1 day   Vivi BarrackSarah Norvell Ureste, MD 06/16/2013, 12:27 PM

## 2013-06-17 LAB — URINE CULTURE

## 2013-06-18 NOTE — ED Provider Notes (Signed)
I saw and evaluated the patient, reviewed the resident's note and I agree with the findings and plan.   EKG Interpretation   Date/Time:  Friday June 15 2013 09:45:53 EDT Ventricular Rate:  67 PR Interval:  178 QRS Duration: 90 QT Interval:  452 QTC Calculation: 477 R Axis:   28 Text Interpretation:  Sinus rhythm Borderline T abnormalities, lateral  leads ST elevation, consider inferior injury Borderline prolonged QT  interval Confirmed by Freida BusmanALLEN  MD, Jazmeen Axtell ([id number]) on 06/15/2013 9:50:14 AM       Toy BakerAnthony T Rosamaria Donn, MD 06/18/13 [phone]

## 2013-06-21 LAB — GC/CHLAMYDIA PROBE AMP
CT Probe RNA: NEGATIVE
GC PROBE AMP APTIMA: NEGATIVE

## 2013-07-10 ENCOUNTER — Emergency Department (HOSPITAL_COMMUNITY): Payer: Medicare Other

## 2013-07-10 ENCOUNTER — Encounter (HOSPITAL_COMMUNITY): Payer: Self-pay | Admitting: Emergency Medicine

## 2013-07-10 ENCOUNTER — Inpatient Hospital Stay (HOSPITAL_COMMUNITY)
Admission: EM | Admit: 2013-07-10 | Discharge: 2013-07-12 | DRG: 638 | Disposition: A | Discharge status: Home / Self Care | Payer: Medicare Other | Attending: Internal Medicine | Admitting: Internal Medicine

## 2013-07-10 DIAGNOSIS — I10 Essential (primary) hypertension: Secondary | ICD-10-CM

## 2013-07-10 DIAGNOSIS — Z6841 Body Mass Index (BMI) 40.0 and over, adult: Secondary | ICD-10-CM

## 2013-07-10 DIAGNOSIS — Z794 Long term (current) use of insulin: Secondary | ICD-10-CM

## 2013-07-10 DIAGNOSIS — E860 Dehydration: Secondary | ICD-10-CM | POA: Diagnosis present

## 2013-07-10 DIAGNOSIS — Z79899 Other long term (current) drug therapy: Secondary | ICD-10-CM

## 2013-07-10 DIAGNOSIS — Z7982 Long term (current) use of aspirin: Secondary | ICD-10-CM

## 2013-07-10 DIAGNOSIS — Z91010 Allergy to peanuts: Secondary | ICD-10-CM

## 2013-07-10 DIAGNOSIS — E785 Hyperlipidemia, unspecified: Secondary | ICD-10-CM

## 2013-07-10 DIAGNOSIS — E7800 Pure hypercholesterolemia, unspecified: Secondary | ICD-10-CM | POA: Diagnosis present

## 2013-07-10 DIAGNOSIS — E1310 Other specified diabetes mellitus with ketoacidosis without coma: Principal | ICD-10-CM

## 2013-07-10 DIAGNOSIS — K219 Gastro-esophageal reflux disease without esophagitis: Secondary | ICD-10-CM

## 2013-07-10 DIAGNOSIS — Z8673 Personal history of transient ischemic attack (TIA), and cerebral infarction without residual deficits: Secondary | ICD-10-CM

## 2013-07-10 DIAGNOSIS — E1110 Type 2 diabetes mellitus with ketoacidosis without coma: Secondary | ICD-10-CM | POA: Diagnosis present

## 2013-07-10 DIAGNOSIS — K5900 Constipation, unspecified: Secondary | ICD-10-CM

## 2013-07-10 LAB — URINE MICROSCOPIC-ADD ON

## 2013-07-10 LAB — BASIC METABOLIC PANEL
BUN: 11 mg/dL (ref 6–23)
BUN: 10 mg/dL (ref 6–23)
BUN: 10 mg/dL (ref 6–23)
CALCIUM: 8.4 mg/dL (ref 8.4–10.5)
CALCIUM: 8.4 mg/dL (ref 8.4–10.5)
CHLORIDE: 103 meq/L (ref 96–112)
CO2: 17 mEq/L — ABNORMAL LOW (ref 19–32)
CO2: 18 mEq/L — ABNORMAL LOW (ref 19–32)
CO2: 21 mEq/L (ref 19–32)
CREATININE: 0.92 mg/dL (ref 0.50–1.35)
Calcium: 8.3 mg/dL — ABNORMAL LOW (ref 8.4–10.5)
Chloride: 98 mEq/L (ref 96–112)
Chloride: 101 mEq/L (ref 96–112)
Creatinine, Ser: 0.89 mg/dL (ref 0.50–1.35)
Creatinine, Ser: 0.86 mg/dL (ref 0.50–1.35)
GFR, EST NON AFRICAN AMERICAN: 89 mL/min — AB (ref >90)
GLUCOSE: 196 mg/dL — AB (ref 70–99)
Glucose, Bld: 423 mg/dL — ABNORMAL HIGH (ref 70–99)
Glucose, Bld: 162 mg/dL — ABNORMAL HIGH (ref 70–99)
POTASSIUM: 4.5 meq/L (ref 3.7–5.3)
POTASSIUM: 4.3 meq/L (ref 3.7–5.3)
Potassium: 4.8 mEq/L (ref 3.7–5.3)
Sodium: 136 mEq/L — ABNORMAL LOW (ref 137–147)
Sodium: 138 mEq/L (ref 137–147)
Sodium: 136 mEq/L — ABNORMAL LOW (ref 137–147)

## 2013-07-10 LAB — CBC WITH DIFFERENTIAL/PLATELET
BASOS ABS: 0 10*3/uL (ref 0.0–0.1)
BASOS PCT: 1 % (ref 0–1)
Eosinophils Absolute: 0.1 10*3/uL (ref 0.0–0.7)
Eosinophils Relative: 1 % (ref 0–5)
HCT: 41.6 % (ref 39.0–52.0)
HEMOGLOBIN: 14.8 g/dL (ref 13.0–17.0)
Lymphocytes Relative: 44 % (ref 12–46)
Lymphs Abs: 1.9 10*3/uL (ref 0.7–4.0)
MCH: 31.1 pg (ref 26.0–34.0)
MCHC: 35.6 g/dL (ref 30.0–36.0)
MCV: 87.4 fL (ref 78.0–100.0)
MONO ABS: 0.1 10*3/uL (ref 0.1–1.0)
Monocytes Relative: 3 % (ref 3–12)
NEUTROS ABS: 2.2 10*3/uL (ref 1.7–7.7)
NEUTROS PCT: 51 % (ref 43–77)
Platelets: 372 10*3/uL (ref 150–400)
RBC: 4.76 MIL/uL (ref 4.22–5.81)
RDW: 13 % (ref 11.5–15.5)
WBC: 4.3 10*3/uL (ref 4.0–10.5)

## 2013-07-10 LAB — URINALYSIS, ROUTINE W REFLEX MICROSCOPIC
BILIRUBIN URINE: NEGATIVE
Glucose, UA: >1000 mg/dL — AB
HGB URINE DIPSTICK: NEGATIVE
Ketones, ur: >80 mg/dL — AB
Leukocytes, UA: NEGATIVE
NITRITE: NEGATIVE
Protein, ur: NEGATIVE mg/dL
Specific Gravity, Urine: 1.035 — ABNORMAL HIGH (ref 1.005–1.030)
UROBILINOGEN UA: 0.2 mg/dL (ref 0.0–1.0)
pH: 5.5 (ref 5.0–8.0)

## 2013-07-10 LAB — POC OCCULT BLOOD, ED: FECAL OCCULT BLD: NEGATIVE

## 2013-07-10 LAB — GLUCOSE, CAPILLARY
GLUCOSE-CAPILLARY: 269 mg/dL — AB (ref 70–99)
GLUCOSE-CAPILLARY: 262 mg/dL — AB (ref 70–99)
Glucose-Capillary: 183 mg/dL — ABNORMAL HIGH (ref 70–99)
Glucose-Capillary: 155 mg/dL — ABNORMAL HIGH (ref 70–99)
Glucose-Capillary: 167 mg/dL — ABNORMAL HIGH (ref 70–99)
Glucose-Capillary: 166 mg/dL — ABNORMAL HIGH (ref 70–99)

## 2013-07-10 LAB — CBG MONITORING, ED
GLUCOSE-CAPILLARY: 526 mg/dL — AB (ref 70–99)
Glucose-Capillary: 293 mg/dL — ABNORMAL HIGH (ref 70–99)

## 2013-07-10 MED ORDER — POLYETHYLENE GLYCOL 3350 17 G PO PACK
17.0000 g | PACK | Freq: Every day | ORAL | Status: DC
  Administered 2013-07-10 – 2013-07-12 (×3): 17 g via ORAL
  Filled 2013-07-10 (×3): qty 1

## 2013-07-10 MED ORDER — DEXTROSE-NACL 5-0.45 % IV SOLN
INTRAVENOUS | Status: DC
  Administered 2013-07-10 – 2013-07-11 (×2): via INTRAVENOUS

## 2013-07-10 MED ORDER — SODIUM CHLORIDE 0.9 % IV SOLN
INTRAVENOUS | Status: DC
  Administered 2013-07-10: 2.3 [IU]/h via INTRAVENOUS
  Filled 2013-07-10: qty 1

## 2013-07-10 MED ORDER — DEXTROSE 50 % IV SOLN: 25.0000 mL | INTRAVENOUS | Status: DC | PRN

## 2013-07-10 MED ORDER — ENOXAPARIN SODIUM 40 MG/0.4ML ~~LOC~~ SOLN
40.0000 mg | SUBCUTANEOUS | Status: DC
  Administered 2013-07-10 – 2013-07-11 (×2): 40 mg via SUBCUTANEOUS
  Filled 2013-07-10 (×3): qty 0.4

## 2013-07-10 MED ORDER — POTASSIUM CHLORIDE 10 MEQ/100ML IV SOLN
10.0000 meq | INTRAVENOUS | Status: AC
  Administered 2013-07-10 (×2): 10 meq via INTRAVENOUS
  Filled 2013-07-10 (×2): qty 100

## 2013-07-10 MED ORDER — INSULIN ASPART 100 UNIT/ML ~~LOC~~ SOLN
10.0000 [IU] | Freq: Once | SUBCUTANEOUS | Status: AC
  Administered 2013-07-10: 10 [IU] via INTRAVENOUS
  Filled 2013-07-10: qty 1

## 2013-07-10 MED ORDER — SODIUM CHLORIDE 0.9 % IV BOLUS (SEPSIS)
1000.0000 mL | Freq: Once | INTRAVENOUS | Status: AC
  Administered 2013-07-10: 1000 mL via INTRAVENOUS

## 2013-07-10 MED ORDER — SODIUM CHLORIDE 0.9 % IV SOLN
INTRAVENOUS | Status: DC
  Administered 2013-07-10: 4.2 [IU]/h via INTRAVENOUS
  Administered 2013-07-10: 6.1 [IU]/h via INTRAVENOUS

## 2013-07-10 MED ORDER — SODIUM CHLORIDE 0.9 % IV SOLN
INTRAVENOUS | Status: DC
  Administered 2013-07-10: 18:00:00 via INTRAVENOUS

## 2013-07-10 MED ORDER — SODIUM CHLORIDE 0.9 % IV SOLN
1000.0000 mL | Freq: Once | INTRAVENOUS | Status: AC
  Administered 2013-07-10: 1000 mL via INTRAVENOUS

## 2013-07-10 MED ORDER — SENNOSIDES-DOCUSATE SODIUM 8.6-50 MG PO TABS
1.0000 | ORAL_TABLET | Freq: Two times a day (BID) | ORAL | Status: DC
  Administered 2013-07-10 – 2013-07-12 (×4): 1 via ORAL
  Filled 2013-07-10 (×4): qty 1

## 2013-07-10 MED ORDER — DEXTROSE-NACL 5-0.45 % IV SOLN: INTRAVENOUS | Status: DC

## 2013-07-10 MED ORDER — SODIUM CHLORIDE 0.9 % IV SOLN
1000.0000 mL | INTRAVENOUS | Status: DC
  Administered 2013-07-10: 1000 mL via INTRAVENOUS

## 2013-07-10 MED ORDER — SODIUM CHLORIDE 0.9 % IV SOLN: INTRAVENOUS | Status: DC

## 2013-07-10 NOTE — ED Notes (Signed)
Patient transported to X-ray 

## 2013-07-10 NOTE — Progress Notes (Signed)
Jeremy SessionsMilton L Farmer [id number] Code Status: Full   Admission Data: 07/10/2013 5:54 PM Attending Provider:  Butcher ONG:EXBM,WUXLKGMPCP:[name],[name] L, MD Consults/ Treatment Team:    Jeremy SessionsMilton L Farmer is a 63 y.o. male patient admitted from ED awake, alert - oriented  X 3 - no acute distress noted.  VSS - Blood pressure 148/85, pulse 56, temperature 98.6 F (37 C), temperature source Oral, resp. rate 18, SpO2 99.00%.  no c/o shortness of breath, no c/o chest pain. Cardiac tele # 14, in place, cardiac monitor yields:normal sinus rhythm.  IV Fluids:  IV in place, occlusive dsg intact without redness, IV cath hand left, condition patent and no redness normal saline.  Allergies:   Allergies  Allergen Reactions  . Peanut-Containing Drug Products Anaphylaxis and Swelling    And peanut oil     Past Medical History  Diagnosis Date  . Hypertension   . High cholesterol   . Type II diabetes mellitus   . GERD (gastroesophageal reflux disease)   . Stroke 2009    "mini stroke; denies residual on 06/15/2013)   Medications Prior to Admission  Medication Sig Dispense Refill  . amLODipine (NORVASC) 5 MG tablet Take 5 mg by mouth every morning.      Marland Kitchen. aspirin EC 81 MG tablet Take 81 mg by mouth every morning.      . carvedilol (COREG) 25 MG tablet Take 25 mg by mouth 2 (two) times daily.      . CRESTOR 10 MG tablet Take 10 mg by mouth at bedtime.      Marland Kitchen. esomeprazole (NEXIUM) 40 MG capsule Take 40 mg by mouth daily before breakfast.      . metFORMIN (GLUCOPHAGE) 1000 MG tablet Take 1,000 mg by mouth 2 (two) times daily with a meal.      . triamcinolone cream (KENALOG) 0.1 % Apply 1 application topically daily as needed. For rash       History:  obtained from the patient. Tobacco/alcohol: denied none  Orientation to room, and floor completed with information packet given to patient/family.  Patient watched safety video s time.  Admission INP armband ID verified with patient/family, and in place.   SR up x 2, fall  assessment complete, with patient and family able to verbalize understanding of risk associated with falls, and verbalized understanding to call nsg before up out of bed.  Call light within reach, patient able to voice, and demonstrate understanding.  Skin, clean-dry- intact without evidence of bruising, or skin tears.   No evidence of skin break down noted on exam.     Will cont to eval and treat per MD orders.  Al DecantFlores, Ruhan Borak F, [place]RN 07/10/2013 5:54 PM

## 2013-07-10 NOTE — ED Notes (Signed)
Pt placed in large gown

## 2013-07-10 NOTE — ED Notes (Signed)
Fm has asked primary RN to f/u on appropriateness SD bed request.

## 2013-07-10 NOTE — ED Provider Notes (Signed)
CSN: [id number]     Arrival date & time 07/10/13  1051 History   First MD Initiated Contact with Patient 07/10/13 1052     Chief Complaint  Patient presents with  . Hyperglycemia  . Dehydration     (Consider location/radiation/quality/duration/timing/severity/associated sxs/prior Treatment) HPI Comments: Patient is a 63 year old male past medical history significant for hypertension, hypercholesterolemia, DM, GERD, history of TIA presented to emergency department for hyperglycemia with associated polyuria, polydipsia, generalized fatigue and weakness. Patient states he takes metformin at home for his diabetes. He states his sugars have been running intermittently high over the last 2 weeks. Patient does endorse constipation x3 weeks with + flatus, nausea, and four episodes of non-bloody emesis. He states he has had one small dark black stool in the last 2 weeks despite using an enema and laxatives.  Patient is a 63 y.o. male presenting with hyperglycemia.  Hyperglycemia Associated symptoms: fatigue, increased thirst, nausea, polyuria and vomiting   Associated symptoms: no abdominal pain and no fever     Past Medical History  Diagnosis Date  . Hypertension   . High cholesterol   . Type II diabetes mellitus   . GERD (gastroesophageal reflux disease)   . Stroke 2009    "mini stroke; denies residual on 06/15/2013)   Past Surgical History  Procedure Laterality Date  . Shoulder open rotator cuff repair Right 20069   History reviewed. No pertinent family history. History  Substance Use Topics  . Smoking status: Never Smoker   . Smokeless tobacco: Never Used  . Alcohol Use: No    Review of Systems  Constitutional: Positive for fatigue. Negative for fever and chills.  Gastrointestinal: Positive for nausea, vomiting and constipation. Negative for abdominal pain.  Endocrine: Positive for polydipsia and polyuria.  All other systems reviewed and are negative.      Allergies   Peanut-containing drug products  Home Medications   Current Outpatient Rx  Name  Route  Sig  Dispense  Refill  . amLODipine (NORVASC) 5 MG tablet   Oral   Take 5 mg by mouth every morning.         Marland Kitchen. aspirin EC 81 MG tablet   Oral   Take 81 mg by mouth every morning.         . carvedilol (COREG) 25 MG tablet   Oral   Take 25 mg by mouth 2 (two) times daily.         . CRESTOR 10 MG tablet   Oral   Take 10 mg by mouth at bedtime.         Marland Kitchen. esomeprazole (NEXIUM) 40 MG capsule   Oral   Take 40 mg by mouth daily before breakfast.         . metFORMIN (GLUCOPHAGE) 1000 MG tablet   Oral   Take 1,000 mg by mouth 2 (two) times daily with a meal.         . triamcinolone cream (KENALOG) 0.1 %   Topical   Apply 1 application topically daily as needed. For rash          BP 111/63  Pulse 62  Temp(Src) 97.8 F (36.6 C) (Oral)  Resp 22  SpO2 100% Physical Exam  Nursing note and vitals reviewed. Constitutional: He is oriented to person, place, and time. He appears well-developed and well-nourished. No distress.  HENT:  Head: Normocephalic and atraumatic.  Right Ear: External ear normal.  Left Ear: External ear normal.  Nose: Nose normal.  Mouth/Throat:  Oropharynx is clear and moist.  Eyes: Conjunctivae are normal.  Neck: Normal range of motion. Neck supple.  Cardiovascular: Normal rate and normal heart sounds.   Pulmonary/Chest: Effort normal and breath sounds normal. No respiratory distress.  Abdominal: Soft. Bowel sounds are normal. There is tenderness in the right lower quadrant. There is no rigidity, no rebound and no guarding.  Genitourinary: Rectum normal and prostate normal. Rectal exam shows no external hemorrhoid, no internal hemorrhoid and no tenderness. Prostate is not tender.  Brown stool on DRE  Musculoskeletal: Normal range of motion.  Neurological: He is alert and oriented to person, place, and time.  Skin: Skin is warm and dry. He is not  diaphoretic.  Psychiatric: He has a normal mood and affect.    ED Course  Procedures (including critical care time) Medications  dextrose 5 %-0.45 % sodium chloride infusion (not administered)  insulin regular (NOVOLIN R,HUMULIN R) 1 Units/mL in sodium chloride 0.9 % 100 mL infusion (not administered)  0.9 %  sodium chloride infusion (1,000 mLs Intravenous New Bag/Given 07/10/13 1432)    Followed by  0.9 %  sodium chloride infusion (not administered)  sodium chloride 0.9 % bolus 1,000 mL (0 mLs Intravenous Stopped 07/10/13 1303)  insulin aspart (novoLOG) injection 10 Units (10 Units Intravenous Given 07/10/13 1304)  sodium chloride 0.9 % bolus 1,000 mL (0 mLs Intravenous Stopped 07/10/13 1145)    Labs Review Labs Reviewed  BASIC METABOLIC PANEL - Abnormal; Notable for the following:    Sodium 136 (*)    CO2 17 (*)    Glucose, Bld 423 (*)    GFR calc non Af Amer 89 (*)    All other components within normal limits  URINALYSIS, ROUTINE W REFLEX MICROSCOPIC - Abnormal; Notable for the following:    Specific Gravity, Urine 1.035 (*)    Glucose, UA >1000 (*)    Ketones, ur >80 (*)    All other components within normal limits  CBG MONITORING, ED - Abnormal; Notable for the following:    Glucose-Capillary 526 (*)    All other components within normal limits  URINE CULTURE  CBC WITH DIFFERENTIAL  URINE MICROSCOPIC-ADD ON  CBG MONITORING, ED  POC OCCULT BLOOD, ED   Imaging Review Dg Abd Acute W/chest  07/10/2013   CLINICAL DATA:  constipation constipation  EXAM: ACUTE ABDOMEN SERIES (ABDOMEN 2 VIEW & CHEST 1 VIEW)  COMPARISON:  None.  FINDINGS: There is no evidence of dilated bowel loops or free intraperitoneal air. No radiopaque calculi or other significant radiographic abnormality is seen. Heart size and mediastinal contours are within normal limits. Both lungs are clear. Moderate to large amount of stool appreciated.  IMPRESSION: Negative abdominal radiographs.  No acute cardiopulmonary  disease.  Moderate to large amount of fecal retention.   Electronically Signed   By: Salome Holmes M.D.   On: 07/10/2013 12:22     EKG Interpretation   Date/Time:  Tuesday July 10 2013 10:57:46 EDT Ventricular Rate:  62 PR Interval:  160 QRS Duration: 81 QT Interval:  410 QTC Calculation: 416 R Axis:   57 Text Interpretation:  Sinus rhythm Borderline T wave abnormalities T waves  less inverted in lateral leads compared to 06/16/13 Confirmed by GOLDSTON   MD, SCOTT ([id number]) on 07/10/2013 11:07:41 AM      MDM   Final diagnoses:  DKA (diabetic ketoacidoses)  Constipation    Filed Vitals:   07/10/13 1434  BP: 111/63  Pulse: 62  Temp:   Resp:  22   Afebrile, NAD, non-toxic appearing, AAOx4. Patient presented for hyperglycemia with 2 weeks of intermittent nausea, vomiting, polyuria, polydipsia. Blood glucose upon arrival was 526. Basic screening labs obtained. Anion gap 21. IV fluids and insulin given glucose stabilizer initiated. Abdominal x-ray obtained moderate to severe stool burden. Abdomen without peritoneal signs. DRE unremarkable. Will admit patient for further evaluation and management. Patient d/w with Dr. Criss Alvine, agrees with plan.       Jeannetta Ellis, PA-C 07/10/13 [phone]

## 2013-07-10 NOTE — H&P (Signed)
Date: 07/10/2013               Patient Name:  Jeremy Farmer MRN: [id number]  DOB: 1950/08/03 Age / Sex: 63 y.o., male   PCP: Willow Ora, MD         Medical Service: Internal Medicine Teaching Service         Attending Physician: Dr. Burns Spain, MD    First Contact: Dr. Yetta Barre Pager: [phone]  Second Contact: Dr. Virgina Organ Pager: [phone]       After Hours (After 5p/  First Contact Pager: [phone]  weekends / holidays): Second Contact Pager: [phone]   Chief Complaint: Weakness, fatigue, polyuria, polydipsia.  History of Present Illness: [name] is a 63 y.o. male w/ PMHx of HTN, HLD, DM type II, GERD, and h/o CVA (2009), presents to the ED w/ complaints of hyperglycemia, w/ associated polyuria, polydipsia and weakness and fatigue. The patient states his blood sugars have been running high over the past 2-3 weeks, ever since he was discharged from the hospital on 06/16/13, when he was admitted for atypical chest pain. He claims he has been waking up 5-6x nightly to go to the bathroom and says he has been very thirsty as a result. He also admits to recent weakness and fatigue w/ exercise, mild DOE, and dizziness. According to [name], he saw his PCP in 03/2013 and his HbA1c was "so low" (6.0), that he was taken off of his Metformin and Lantus at that time. During his last admission on 06/15/13, the patient was shown to have HbA1c of 10.8. He was subsequently sent home on Metformin 1000 mg bid, but claims his blood sugars have still been high at home, as high as 500's.  The patient also endorses constipation for the past 3 weeks w/ associated flatulence as well. He claims he has had 2-3 bowel movements in the past 3 weeks, the last one he described as dark, hard and scant in nature.   He denies any other issues at this time. No abdominal pain, chest pain, dysuria, hematuria, LE swelling, PND, or orthopnea.    Meds: Current Facility-Administered Medications  Medication Dose  Route Frequency Provider Last Rate Last Dose  . 0.9 %  sodium chloride infusion  1,000 mL Intravenous Continuous Jennifer L Piepenbrink, PA-C      . dextrose 5 %-0.45 % sodium chloride infusion   Intravenous Continuous Jennifer L Piepenbrink, PA-C      . insulin regular (NOVOLIN R,HUMULIN R) 1 Units/mL in sodium chloride 0.9 % 100 mL infusion   Intravenous Continuous Lise Auer Piepenbrink, PA-C       Current Outpatient Prescriptions  Medication Sig Dispense Refill  . amLODipine (NORVASC) 5 MG tablet Take 5 mg by mouth every morning.      Marland Kitchen aspirin EC 81 MG tablet Take 81 mg by mouth every morning.      . carvedilol (COREG) 25 MG tablet Take 25 mg by mouth 2 (two) times daily.      . CRESTOR 10 MG tablet Take 10 mg by mouth at bedtime.      Marland Kitchen esomeprazole (NEXIUM) 40 MG capsule Take 40 mg by mouth daily before breakfast.      . metFORMIN (GLUCOPHAGE) 1000 MG tablet Take 1,000 mg by mouth 2 (two) times daily with a meal.      . triamcinolone cream (KENALOG) 0.1 % Apply 1 application topically daily as needed. For rash  Allergies: Allergies as of 07/10/2013 - Review Complete 07/10/2013  Allergen Reaction Noted  . Peanut-containing drug products Anaphylaxis and Swelling 05/29/2012   Past Medical History  Diagnosis Date  . Hypertension   . High cholesterol   . Type II diabetes mellitus   . GERD (gastroesophageal reflux disease)   . Stroke 2009    "mini stroke; denies residual on 06/15/2013)   Past Surgical History  Procedure Laterality Date  . Shoulder open rotator cuff repair Right 20069   History reviewed. No pertinent family history. History   Social History  . Marital Status: Married    Spouse Name: N/A    Number of Children: N/A  . Years of Education: N/A   Occupational History  . Not on file.   Social History Main Topics  . Smoking status: Never Smoker   . Smokeless tobacco: Never Used  . Alcohol Use: No  . Drug Use: No  . Sexual Activity: No   Other Topics  Concern  . Not on file   Social History Narrative  . No narrative on file    Review of Systems: General: Positive for fatigue. Denies fever, chills, diaphoresis, appetite change.  Respiratory: Positive for mild DOE. Denies cough, chest tightness, and wheezing.   Cardiovascular: Denies chest pain and palpitations.  Gastrointestinal: Positive for nausea, vomiting, and constipation. Denies abdominal pain, diarrhea, blood in stool and abdominal distention.  Genitourinary: Positive for increased frequency. Denies dysuria, urgency, hematuria, and flank pain. Endocrine: Positive for polyuria and polydipsia. Denies hot or cold intolerance. Musculoskeletal: Denies myalgias, back pain, joint swelling, arthralgias and gait problem.  Skin: Denies pallor, rash and wounds.  Neurological: Positive for weakness and dizziness. Denies seizures, syncope, lightheadedness, numbness and headaches.  Psychiatric/Behavioral: Denies mood changes, confusion, nervousness, sleep disturbance and agitation.  Physical Exam: Filed Vitals:   07/10/13 1057 07/10/13 1130 07/10/13 1145 07/10/13 1434  BP: 122/61 111/54 101/52 111/63  Pulse: 71 64 62 62  Temp: 97.8 F (36.6 C)     TempSrc: Oral     Resp: 18 22 16 22   SpO2: 100% 97% 100% 100%  General: Vital signs reviewed.  Patient is an obese male, in no acute distress and cooperative with exam.  Head: Normocephalic and atraumatic. Eyes: PERRL, EOMI, conjunctivae normal, No scleral icterus.  Neck: Supple, trachea midline, normal ROM, No JVD, masses, thyromegaly, or carotid bruit present.  Cardiovascular: RRR, S1 normal, S2 normal, no murmurs, gallops, or rubs. Pulmonary/Chest: Air entry equal bilaterally, no wheezes, rales, or rhonchi. Abdominal: Soft, non-tender, non-distended, BS +, no masses, organomegaly, or guarding present.  Musculoskeletal: No joint deformities, erythema, or stiffness, ROM full and nontender. Extremities: Trace pitting edema, pulses symmetric  and intact bilaterally. No cyanosis or clubbing. Scab present on right pre-tibial region, healing. Feet w/out ulcerations.  Neurological: A&O x3, Strength is normal and symmetric bilaterally, cranial nerve II-XII are grossly intact, no focal motor deficit, sensory intact to light touch bilaterally.  Skin: Warm, dry and intact. No rashes or erythema. Psychiatric: Normal mood and affect. speech and behavior is normal. Cognition and memory are normal.   Lab results: Basic Metabolic Panel:  Recent Labs  16/10/96 1316  NA 136*  K 4.8  CL 98  CO2 17*  GLUCOSE 423*  BUN 11  CREATININE 0.92  CALCIUM 8.4   CBC:  Recent Labs  07/10/13 1316  WBC 4.3  NEUTROABS 2.2  HGB 14.8  HCT 41.6  MCV 87.4  PLT 372   CBG:  Recent Labs  07/10/13 1103  GLUCAP 526*   Urinalysis:  Recent Labs  07/10/13 1146  COLORURINE YELLOW  LABSPEC 1.035*  PHURINE 5.5  GLUCOSEU >1000*  HGBUR NEGATIVE  BILIRUBINUR NEGATIVE  KETONESUR >80*  PROTEINUR NEGATIVE  UROBILINOGEN 0.2  NITRITE NEGATIVE  LEUKOCYTESUR NEGATIVE   Imaging results:  Dg Abd Acute W/chest  07/10/2013   CLINICAL DATA:  constipation constipation  EXAM: ACUTE ABDOMEN SERIES (ABDOMEN 2 VIEW & CHEST 1 VIEW)  COMPARISON:  None.  FINDINGS: There is no evidence of dilated bowel loops or free intraperitoneal air. No radiopaque calculi or other significant radiographic abnormality is seen. Heart size and mediastinal contours are within normal limits. Both lungs are clear. Moderate to large amount of stool appreciated.  IMPRESSION: Negative abdominal radiographs.  No acute cardiopulmonary disease.  Moderate to large amount of fecal retention.   Electronically Signed   By: Salome Holmes M.D.   On: 07/10/2013 12:22   Other results: EKG: NSR @ 62 bpm. T-wave flattening in lateral leads. Inverted lateral t-waves on 06/16/13  Assessment & Plan by Problem: Mr. MONICO SUDDUTH is a 63 y.o. male w/ PMHx of HTN, HLD, DM type II, GERD, and h/o CVA  (2009), admitted for mild DKA.  DM type II w/ DKA- Patient complains of recent hyperglycemia at home w/ polyuria and polydipsia. Admitted w/ CBG of 524, HCO3 of 17 and AG of 21. UA w/ ketones >80. Recently admitted on 06/16/13 for chest pain, found to be hyperglycemic at that time w/ associated polyuria and polydipsia as well. Previous HbA1c of 6.0 in 03/2013 (stopped all oral hypoglycemics + insulin at that time), and repeat on previous admission found to be 10.8. Hyperglycemia and worsening HbA1c at previous admission thought to be 2/2 UTI vs dietary non-compliance. Patient was discharged on 06/17/13 w/ Metformin 1000 mg bid, claims he has been compliant with this. Etiology of DKA appears to be 2/2 poor glucose control. Patient denies recent illness, chest pain, dysuria, alcohol or drug abuse. Patient claims that he has been symptomatic ever since he was taken off of insulin, therefore it seems that his hyperglycemia is 2/2 poor diet control and inadequate control of his DM w/ simply Metformin.   -Admit to stepdown -DKA protocol; Insulin gtt -IVF's; NS @ 150 ml/hr, change to D5 1/2NS when CBG's <250 -BMP q2h -NPO for now -When AG closes, will give Lantus 20 units -Will need Lantus to be restarted on discharge.  Constipation- Patient claims he has had 2 BM's in the past 3 weeks. Says he has been passing lots of flatus, but without frequent bowel movements. His last stool he claims was small and dark in nature. Given his recent description of polyuria in the setting of severe hyperglycemia, I suspect these two things are related. Patient claims that when his blood sugar was well controlled, he had regular bowel movements. Abd XR shows moderate to large stool retention, w/out obvious obstruction.  -IVF's as above -Miralax + Senekot  HTN- Patient normotensive on admission. Takes Norvasc 5 mg po qd + Coreg 25 mg po bid at home.  -Given normal/soft BP, will hold home meds for now.  HLD- No lipid panel on  file. On Crestor 10 mg po qhs at home. -Hold statin while NPO  GERD- On Nexium 40 mg po qd at home.  -Hold while NPO  DVT/PE PPx- Lovenox [place]  Dispo: Disposition is deferred at this time, awaiting improvement of current medical problems. Anticipated discharge in approximately 1-2 day(s).  The patient does have a current PCP Willow Ora([name] L [name], MD) and [facility] after discharge.  The patient does not have transportation limitations that hinder transportation to clinic appointments.  Signed: Courtney ParisEden W Ardian Haberland, MD 07/10/2013, 3:00 PM

## 2013-07-10 NOTE — Progress Notes (Signed)
[facility] and received [facility]

## 2013-07-10 NOTE — ED Notes (Signed)
Pt sts only 2 BM since 06/17/13; pt sts stool in black and has foul odor when went

## 2013-07-10 NOTE — ED Notes (Signed)
Per EMS: pt from PCP c/o DM w/ recent hyperglycemia; pt reading high x 2 weeks; pt takes metformin; pt sts PU/PD: pt noted to be orthostatic per EMS; pt given fluid by EMS; pt noted to be tachyonic; pt sts some generalized weakness with exertion; IV L hand 20g

## 2013-07-10 NOTE — Progress Notes (Signed)
Per MD okay to give mirlax even if pt is NPO. Per pharmacy, okay to give potassium with insulin drip. Will continue to monitor per MD order.

## 2013-07-11 LAB — GLUCOSE, CAPILLARY
GLUCOSE-CAPILLARY: 128 mg/dL — AB (ref 70–99)
GLUCOSE-CAPILLARY: 148 mg/dL — AB (ref 70–99)
GLUCOSE-CAPILLARY: 149 mg/dL — AB (ref 70–99)
GLUCOSE-CAPILLARY: 153 mg/dL — AB (ref 70–99)
GLUCOSE-CAPILLARY: 155 mg/dL — AB (ref 70–99)
GLUCOSE-CAPILLARY: 181 mg/dL — AB (ref 70–99)
Glucose-Capillary: 148 mg/dL — ABNORMAL HIGH (ref 70–99)
Glucose-Capillary: 157 mg/dL — ABNORMAL HIGH (ref 70–99)
Glucose-Capillary: 130 mg/dL — ABNORMAL HIGH (ref 70–99)
Glucose-Capillary: 154 mg/dL — ABNORMAL HIGH (ref 70–99)
Glucose-Capillary: 142 mg/dL — ABNORMAL HIGH (ref 70–99)
Glucose-Capillary: 177 mg/dL — ABNORMAL HIGH (ref 70–99)
Glucose-Capillary: 180 mg/dL — ABNORMAL HIGH (ref 70–99)
Glucose-Capillary: 180 mg/dL — ABNORMAL HIGH (ref 70–99)
Glucose-Capillary: 207 mg/dL — ABNORMAL HIGH (ref 70–99)
Glucose-Capillary: 208 mg/dL — ABNORMAL HIGH (ref 70–99)
Glucose-Capillary: 286 mg/dL — ABNORMAL HIGH (ref 70–99)

## 2013-07-11 LAB — BASIC METABOLIC PANEL
BUN: 10 mg/dL (ref 6–23)
BUN: 9 mg/dL (ref 6–23)
BUN: 9 mg/dL (ref 6–23)
BUN: 8 mg/dL (ref 6–23)
BUN: 7 mg/dL (ref 6–23)
BUN: 7 mg/dL (ref 6–23)
CALCIUM: 8.5 mg/dL (ref 8.4–10.5)
CALCIUM: 8.6 mg/dL (ref 8.4–10.5)
CHLORIDE: 101 meq/L (ref 96–112)
CHLORIDE: 101 meq/L (ref 96–112)
CO2: 18 mEq/L — ABNORMAL LOW (ref 19–32)
CO2: 18 mEq/L — ABNORMAL LOW (ref 19–32)
CO2: 21 mEq/L (ref 19–32)
CO2: 21 mEq/L (ref 19–32)
CO2: 21 mEq/L (ref 19–32)
CO2: 19 mEq/L (ref 19–32)
CREATININE: 0.81 mg/dL (ref 0.50–1.35)
CREATININE: 0.87 mg/dL (ref 0.50–1.35)
CREATININE: 0.9 mg/dL (ref 0.50–1.35)
Calcium: 8.2 mg/dL — ABNORMAL LOW (ref 8.4–10.5)
Calcium: 8.2 mg/dL — ABNORMAL LOW (ref 8.4–10.5)
Calcium: 8.1 mg/dL — ABNORMAL LOW (ref 8.4–10.5)
Calcium: 8.1 mg/dL — ABNORMAL LOW (ref 8.4–10.5)
Chloride: 100 mEq/L (ref 96–112)
Chloride: 101 mEq/L (ref 96–112)
Chloride: 102 mEq/L (ref 96–112)
Chloride: 100 mEq/L (ref 96–112)
Creatinine, Ser: 0.92 mg/dL (ref 0.50–1.35)
Creatinine, Ser: 0.84 mg/dL (ref 0.50–1.35)
Creatinine, Ser: 0.8 mg/dL (ref 0.50–1.35)
GFR calc Af Amer: >90 mL/min (ref >90)
GFR calc Af Amer: >90 mL/min (ref >90)
GFR calc non Af Amer: >90 mL/min (ref >90)
GFR calc non Af Amer: >90 mL/min (ref >90)
GFR calc non Af Amer: >90 mL/min (ref >90)
GFR, EST NON AFRICAN AMERICAN: 89 mL/min — AB (ref >90)
GLUCOSE: 147 mg/dL — AB (ref 70–99)
Glucose, Bld: 144 mg/dL — ABNORMAL HIGH (ref 70–99)
Glucose, Bld: 154 mg/dL — ABNORMAL HIGH (ref 70–99)
Glucose, Bld: 207 mg/dL — ABNORMAL HIGH (ref 70–99)
Glucose, Bld: 137 mg/dL — ABNORMAL HIGH (ref 70–99)
Glucose, Bld: 289 mg/dL — ABNORMAL HIGH (ref 70–99)
POTASSIUM: 4 meq/L (ref 3.7–5.3)
POTASSIUM: 3.6 meq/L — AB (ref 3.7–5.3)
POTASSIUM: 4 meq/L (ref 3.7–5.3)
Potassium: 4.2 mEq/L (ref 3.7–5.3)
Potassium: 3.8 mEq/L (ref 3.7–5.3)
Potassium: 4.4 mEq/L (ref 3.7–5.3)
SODIUM: 135 meq/L — AB (ref 137–147)
SODIUM: 134 meq/L — AB (ref 137–147)
SODIUM: 136 meq/L — AB (ref 137–147)
Sodium: 135 mEq/L — ABNORMAL LOW (ref 137–147)
Sodium: 137 mEq/L (ref 137–147)
Sodium: 134 mEq/L — ABNORMAL LOW (ref 137–147)

## 2013-07-11 MED ORDER — PANTOPRAZOLE SODIUM 40 MG PO TBEC
40.0000 mg | DELAYED_RELEASE_TABLET | Freq: Every day | ORAL | Status: DC
  Administered 2013-07-11 – 2013-07-12 (×2): 40 mg via ORAL
  Filled 2013-07-11 (×2): qty 1

## 2013-07-11 MED ORDER — INSULIN ASPART 100 UNIT/ML ~~LOC~~ SOLN
0.0000 [IU] | Freq: Three times a day (TID) | SUBCUTANEOUS | Status: DC
  Administered 2013-07-11: 3 [IU] via SUBCUTANEOUS

## 2013-07-11 MED ORDER — POTASSIUM CHLORIDE 10 MEQ/100ML IV SOLN
10.0000 meq | INTRAVENOUS | Status: AC
  Administered 2013-07-11 (×2): 10 meq via INTRAVENOUS
  Filled 2013-07-11 (×2): qty 100

## 2013-07-11 MED ORDER — INSULIN GLARGINE 100 UNIT/ML ~~LOC~~ SOLN
20.0000 [IU] | Freq: Every day | SUBCUTANEOUS | Status: DC
  Administered 2013-07-11: 20 [IU] via SUBCUTANEOUS
  Filled 2013-07-11 (×2): qty 0.2

## 2013-07-11 MED ORDER — SODIUM CHLORIDE 0.9 % IV SOLN
INTRAVENOUS | Status: AC
  Administered 2013-07-12: 1000 mL via INTRAVENOUS

## 2013-07-11 MED ORDER — SODIUM CHLORIDE 0.9 % IV SOLN
INTRAVENOUS | Status: AC
  Administered 2013-07-11: 23:00:00 via INTRAVENOUS
  Administered 2013-07-11: 100 mL/h via INTRAVENOUS

## 2013-07-11 MED ORDER — POLYETHYLENE GLYCOL 3350 17 G PO PACK
17.0000 g | PACK | Freq: Once | ORAL | Status: AC
  Administered 2013-07-11: 17 g via ORAL
  Filled 2013-07-11 (×2): qty 1

## 2013-07-11 MED ORDER — ATORVASTATIN CALCIUM 20 MG PO TABS
20.0000 mg | ORAL_TABLET | Freq: Every day | ORAL | Status: DC
  Administered 2013-07-11: 20 mg via ORAL
  Filled 2013-07-11 (×2): qty 1

## 2013-07-11 NOTE — Progress Notes (Signed)
On call MD paged due to pt's CO2 18 (goal less than 20) and past 7 blood sugars within target range. MD state will look at chart and place orders.

## 2013-07-11 NOTE — Progress Notes (Signed)
Subjective: Patient seen at bedside this AM. Patient has no complaints. Says he is feeling much better than yesterday. Denies dizziness, lightheadedness, polyuria, or polydipsia. No chest pain, SOB, or palpitations. Still has not had a BM. Says he is hungry and ready to eat.   Objective: Vital signs in last 24 hours: Filed Vitals:   07/10/13 2040 07/10/13 2353 07/11/13 0403 07/11/13 0800  BP: 106/56 115/71 110/71 129/79  Pulse: 55 55 56 53  Temp: 98.4 F (36.9 C) 98.2 F (36.8 C) 97.8 F (36.6 C) 97.9 F (36.6 C)  TempSrc: Oral Oral Oral Oral  Resp: 18 18 18 18   Height:      Weight:      SpO2: 96% 96% 95% 99%   Weight change:   Intake/Output Summary (Last 24 hours) at 07/11/13 1342 Last data filed at 07/11/13 0602  Gross per 24 hour  Intake 991.67 ml  Output    400 ml  Net 591.67 ml   Physical Exam: General: Alert, cooperative, NAD. HEENT: PERRL, EOMI. Moist mucus membranes Neck: Full range of motion without pain, supple, no lymphadenopathy or carotid bruits Lungs: Clear to ascultation bilaterally, normal work of respiration, no wheezes, rales, rhonchi Heart: RRR, no murmurs, gallops, or rubs Abdomen: Soft, non-tender, non-distended, BS + Extremities: No cyanosis, clubbing, or edema Neurologic: Alert & oriented X3, cranial nerves II-XII intact, strength grossly intact, sensation intact to light touch  Lab Results: Basic Metabolic Panel:  Recent Labs Lab 07/11/13 0554 07/11/13 1015  NA 134* 135*  K 4.2 3.6*  CL 101 100  CO2 18* 21  GLUCOSE 154* 207*  BUN 9 9  CREATININE 0.84 0.81  CALCIUM 8.2* 8.1*   CBC:  Recent Labs Lab 07/10/13 1316  WBC 4.3  NEUTROABS 2.2  HGB 14.8  HCT 41.6  MCV 87.4  PLT 372   CBG:  Recent Labs Lab 07/11/13 0756 07/11/13 0904 07/11/13 1002 07/11/13 1112 07/11/13 1206 07/11/13 1320  GLUCAP 180* 180* 207* 181* 155* 149*   Urinalysis:  Recent Labs Lab 07/10/13 1146  COLORURINE YELLOW  LABSPEC 1.035*  PHURINE  5.5  GLUCOSEU >1000*  HGBUR NEGATIVE  BILIRUBINUR NEGATIVE  KETONESUR >80*  PROTEINUR NEGATIVE  UROBILINOGEN 0.2  NITRITE NEGATIVE  LEUKOCYTESUR NEGATIVE   Micro Results: Recent Results (from the past 240 hour(s))  URINE CULTURE     Status: None   Collection Time    07/10/13 11:46 AM      Result Value Ref Range Status   Specimen Description URINE, CLEAN CATCH   Final   Special Requests NONE   Final   Culture  Setup Time     Final   Value: 07/10/2013 12:15     Performed at [facility] Count     Final   Value: 70,000 COLONIES/ML     Performed at [facility]   Culture     Final   Value: GRAM NEGATIVE RODS     Performed at [facility]   Report Status PENDING   Incomplete   Studies/Results: Dg Abd Acute W/chest  07/10/2013   CLINICAL DATA:  constipation constipation  EXAM: ACUTE ABDOMEN SERIES (ABDOMEN 2 VIEW & CHEST 1 VIEW)  COMPARISON:  None.  FINDINGS: There is no evidence of dilated bowel loops or free intraperitoneal air. No radiopaque calculi or other significant radiographic abnormality is seen. Heart size and mediastinal contours are within normal limits. Both lungs are clear. Moderate to large amount of stool appreciated.  IMPRESSION: Negative abdominal radiographs.  No acute cardiopulmonary disease.  Moderate to large amount of fecal retention.   Electronically Signed   By: Salome HolmesHector  Cooper M.D.   On: 07/10/2013 12:22   Medications: I have reviewed the patient's current medications. Scheduled Meds: . enoxaparin (LOVENOX) injection  40 mg Subcutaneous Q24H  . insulin glargine  20 Units Subcutaneous Daily  . polyethylene glycol  17 g Oral Daily  . polyethylene glycol  17 g Oral Once  . potassium chloride  10 mEq Intravenous Q1 Hr x 2  . senna-docusate  1 tablet Oral BID   Continuous Infusions: . sodium chloride    . insulin (NOVOLIN-R) infusion 3.6 Units/hr (07/11/13 1321)   PRN Meds:.dextrose  Assessment/Plan: Mr. Roetta SessionsMilton L Millis is  a 63 y.o. male w/ PMHx of HTN, HLD, DM type II, GERD, and h/o CVA (2009), admitted for mild DKA.  DM type II w/ DKA- Resolving. Most recent HCO3 of 21, AG of 14. CBG's in the 100's. No symptoms as described previously. -D/c Insulin gtt -Started Lantus 20 units qd -ISS-S + CBG's AC/HS -Restart diet; carb modified -IVF; NS @ 100 ml/hr for 10 hours -Repeat BMP in PM to reassess AG  Constipation- Still without BM. XR abdomen significant for moderate stool burden, w/out signs of obstruction. Patient is passing gas.  -IVF's as above  -Miralax + Senekot; gave 1 extra dose of Miralax in PM  HTN- Patient normotensive. Takes Norvasc 5 mg po qd + Coreg 25 mg po bid at home.  -Given normal/soft BP, will hold home meds for now.   HLD- No lipid panel on file. On Crestor 10 mg po qhs at home.  -Restarted Lipitor 20 mg qhs  GERD- On Nexium 40 mg po qd at home.  -Protonix 40 mg po qd  DVT/PE PPx- Lovenox [place]  Dispo: Disposition is deferred at this time, awaiting improvement of current medical problems.  Anticipated discharge in approximately 1-2 day(s).   The patient does have a current PCP Willow Ora([name] L [name], MD) and [facility] after discharge.  The patient does not have transportation limitations that hinder transportation to clinic appointments.  .Services Needed at time of discharge: Y = Yes, Blank = No PT:   OT:   RN:   Equipment:   Other:     LOS: 1 day   Courtney ParisEden W Siah Steely, MD 07/11/2013, 1:42 PM

## 2013-07-11 NOTE — Progress Notes (Signed)
On call MD state want to leave pt on glucostabilizer until AM labs are drawn due to anion gap still being elevated.

## 2013-07-11 NOTE — H&P (Signed)
  Date: 07/11/2013  Patient name: Jeremy SessionsMilton L Farmer  Medical record number: [id number]  Date of birth: 11-07-1950   I have seen and evaluated Jeremy Farmer and discussed their care with the Residency Team. Jeremy Farmer had been on Lantus 20 and Metformin 1000 BID. His A1C was 6.0 in Dec 2014 and his PCP told him to stop both meds as his DM was well controlled. Of note, he had lost about 65 lbs bc he was living in [place], eating quite well, and walking 10 miles daily. Since returning from [place], his diet has worsened (wife was still there) and he stopped walking (was winter). His metformin was resumed in March but he remained hyperglycemic and presented in DKA (no prior admits for DKA). Even though he is Type II, DKA is supported by ketonuria, low bicarb, and elevated gap. He responded well to standard DKA tx.   PMHx, meds, allergies, soc hx, and family hx were reviewed  Assessment and Plan: I have seen and evaluated the patient as outlined above. I agree with the formulated Assessment and Plan as detailed in the residents' admission note, with the following changes:   1. DKA / Type II DM uncontrolled - DKA resolved and Dr Yetta BarreJones has modified IVF, insulin, and diet. He will resume Lantus 20 and Metformin 1000 BID. His wife has already put him back on a healthy diet and he is motivated to lose weight. Likely D/C in AM.   Burns SpainElizabeth A Butcher, MD 4/8/20153:03 PM

## 2013-07-11 NOTE — Progress Notes (Signed)
Nutrition Brief Note  Patient identified on the Malnutrition Screening Tool (MST) Report. Pt reports that his recent weights have varied; he has been as heavy as 288 lb and as low as 265 lb. Pt denies any questions or concerns. Appears well-nourished; reports great appetite PTA.  Wt Readings from Last 15 Encounters:  07/10/13 275 lb 9.2 oz (125 kg)    Body mass index is 40.68 kg/(m^2). Patient meets criteria for Obese Class III based on current BMI.   Current diet order is NPO. Labs and medications reviewed.   No nutrition interventions warranted at this time. If nutrition issues arise, please consult RD.   Jeremy MottoSamantha Ricardo Schubach MS, RD, LDN Inpatient Registered Dietitian Pager: [phone] After-hours pager: [phone]

## 2013-07-11 NOTE — ED Provider Notes (Signed)
Medical screening examination/treatment/procedure(s) were performed by non-physician practitioner and as supervising physician I was immediately available for consultation/collaboration.   EKG Interpretation   Date/Time:  Tuesday July 10 2013 10:57:46 EDT Ventricular Rate:  62 PR Interval:  160 QRS Duration: 81 QT Interval:  410 QTC Calculation: 416 R Axis:   57 Text Interpretation:  Sinus rhythm Borderline T wave abnormalities T waves  less inverted in lateral leads compared to 06/16/13 Confirmed by Lilith Solana   MD, Alyannah Sanks ([id number]) on 07/10/2013 11:07:41 AM        Audree CamelScott T Harryette Shuart, MD 07/11/13 [id number]

## 2013-07-12 LAB — BASIC METABOLIC PANEL
BUN: 8 mg/dL (ref 6–23)
BUN: 7 mg/dL (ref 6–23)
CHLORIDE: 103 meq/L (ref 96–112)
CO2: 19 mEq/L (ref 19–32)
CO2: 20 mEq/L (ref 19–32)
CREATININE: 0.87 mg/dL (ref 0.50–1.35)
CREATININE: 0.82 mg/dL (ref 0.50–1.35)
Calcium: 8.5 mg/dL (ref 8.4–10.5)
Calcium: 8.4 mg/dL (ref 8.4–10.5)
Chloride: 103 mEq/L (ref 96–112)
GFR calc non Af Amer: >90 mL/min (ref >90)
GFR calc non Af Amer: >90 mL/min (ref >90)
GLUCOSE: 293 mg/dL — AB (ref 70–99)
Glucose, Bld: 300 mg/dL — ABNORMAL HIGH (ref 70–99)
Potassium: 5.3 mEq/L (ref 3.7–5.3)
Potassium: 4.7 mEq/L (ref 3.7–5.3)
Sodium: 139 mEq/L (ref 137–147)
Sodium: 138 mEq/L (ref 137–147)

## 2013-07-12 LAB — URINE CULTURE: Colony Count: 70000

## 2013-07-12 LAB — GLUCOSE, CAPILLARY
Glucose-Capillary: 310 mg/dL — ABNORMAL HIGH (ref 70–99)
Glucose-Capillary: 361 mg/dL — ABNORMAL HIGH (ref 70–99)
Glucose-Capillary: 298 mg/dL — ABNORMAL HIGH (ref 70–99)

## 2013-07-12 MED ORDER — INSULIN PEN NEEDLE 33G X 6 MM MISC: 1.0000 | Freq: Every day | Status: DC

## 2013-07-12 MED ORDER — AMLODIPINE BESYLATE 5 MG PO TABS
5.0000 mg | ORAL_TABLET | Freq: Every day | ORAL | Status: DC
  Administered 2013-07-12: 5 mg via ORAL
  Filled 2013-07-12: qty 1

## 2013-07-12 MED ORDER — INSULIN GLARGINE 100 UNIT/ML ~~LOC~~ SOLN
25.0000 [IU] | Freq: Every day | SUBCUTANEOUS | Status: DC
  Administered 2013-07-12: 25 [IU] via SUBCUTANEOUS
  Filled 2013-07-12 (×2): qty 0.25

## 2013-07-12 MED ORDER — SENNOSIDES-DOCUSATE SODIUM 8.6-50 MG PO TABS: 1.0000 | ORAL_TABLET | Freq: Two times a day (BID) | ORAL | Status: DC

## 2013-07-12 MED ORDER — INSULIN GLARGINE 100 UNIT/ML SOLOSTAR PEN: 30.0000 [IU] | PEN_INJECTOR | Freq: Every day | SUBCUTANEOUS | Status: DC

## 2013-07-12 MED ORDER — POLYETHYLENE GLYCOL 3350 17 G PO PACK: 17.0000 g | PACK | Freq: Every day | ORAL | Status: DC

## 2013-07-12 MED ORDER — INSULIN ASPART 100 UNIT/ML ~~LOC~~ SOLN
0.0000 [IU] | Freq: Three times a day (TID) | SUBCUTANEOUS | Status: DC
  Administered 2013-07-12: 11 [IU] via SUBCUTANEOUS
  Administered 2013-07-12: 15 [IU] via SUBCUTANEOUS

## 2013-07-12 MED ORDER — SODIUM CHLORIDE 0.9 % IV BOLUS (SEPSIS)
1000.0000 mL | Freq: Once | INTRAVENOUS | Status: AC
  Administered 2013-07-12: 1000 mL via INTRAVENOUS

## 2013-07-12 MED ORDER — AMLODIPINE BESYLATE 5 MG PO TABS: 5.0000 mg | ORAL_TABLET | Freq: Every day | ORAL | Status: DC

## 2013-07-12 MED ORDER — WHITE PETROLATUM GEL
Status: AC
  Administered 2013-07-12: 0.2
  Filled 2013-07-12: qty 5

## 2013-07-12 NOTE — Progress Notes (Signed)
NURSING PROGRESS NOTE  Jeremy SessionsMilton L Cinquemani [id number] Discharge Data: 07/12/2013 5:57 PM Attending Provider: Burns SpainElizabeth A Butcher, MD WUJ:WJXB,JYNWGNFPCP:[name],[name] Elbert EwingsL, MD     Jeremy SessionsMilton L Farmer to be D/C'd Home per MD order.  Discussed with the patient the After Visit Summary and all questions fully answered. All IV's discontinued with no bleeding noted. All belongings returned to patient for patient to take home.   Last Vital Signs:  Blood pressure 159/92, pulse 77, temperature 98.3 F (36.8 C), temperature source Oral, resp. rate 20, height 5\' 9"  (1.753 m), weight 125 kg (275 lb 9.2 oz), SpO2 96.00%.  Discharge Medication List   Medication List    STOP taking these medications       carvedilol 25 MG tablet  Commonly known as:  COREG      TAKE these medications       amLODipine 5 MG tablet  Commonly known as:  NORVASC  Take 1 tablet (5 mg total) by mouth daily.     aspirin EC 81 MG tablet  Take 81 mg by mouth every morning.     CRESTOR 10 MG tablet  Generic drug:  rosuvastatin  Take 10 mg by mouth at bedtime.     esomeprazole 40 MG capsule  Commonly known as:  NEXIUM  Take 40 mg by mouth daily before breakfast.     Insulin Glargine 100 UNIT/ML Solostar Pen  Commonly known as:  LANTUS SOLOSTAR  Inject 30 Units into the skin daily at 10 pm.     Insulin Pen Needle 33G X 6 MM Misc  1 each by Does not apply route at bedtime.     metFORMIN 1000 MG tablet  Commonly known as:  GLUCOPHAGE  Take 1,000 mg by mouth 2 (two) times daily with a meal.     polyethylene glycol packet  Commonly known as:  MIRALAX / GLYCOLAX  Take 17 g by mouth daily.     senna-docusate 8.6-50 MG per tablet  Commonly known as:  Senokot-S  Take 1 tablet by mouth 2 (two) times daily.     triamcinolone cream 0.1 %  Commonly known as:  KENALOG  Apply 1 application topically daily as needed. For rash

## 2013-07-12 NOTE — Progress Notes (Signed)
Subjective: Patient seen at bedside this AM. Says he is feeling okay this morning. Has had some dizziness today. CBG 310 this morning. Denies polyuria, weakness, or lethargy. No SOB, chest pain, or palpitations. Still has not had a BM since admission.   Objective: Vital signs in last 24 hours: Filed Vitals:   07/12/13 0408 07/12/13 0954 07/12/13 0957 07/12/13 1000  BP: 131/80 146/79 126/73 131/68  Pulse: 55 57 61 75  Temp: 98.2 F (36.8 C)     TempSrc: Oral     Resp: 18     Height:      Weight: 275 lb 9.2 oz (125 kg)     SpO2: 96% 96% 98% 95%   Weight change: 0 lb (0 kg)  Intake/Output Summary (Last 24 hours) at 07/12/13 1236 Last data filed at 07/12/13 0854  Gross per 24 hour  Intake 1596.66 ml  Output   1400 ml  Net 196.66 ml   Physical Exam: General: Alert, cooperative, NAD. HEENT: PERRL, EOMI. Moist mucus membranes Neck: Full range of motion without pain, supple, no lymphadenopathy or carotid bruits Lungs: Clear to ascultation bilaterally, normal work of respiration, no wheezes, rales, rhonchi. Heart: RRR, no murmurs, gallops, or rubs. Abdomen: Soft, non-tender, non-distended, BS + Extremities: No cyanosis, clubbing, or edema. Neurologic: Alert & oriented x3, cranial nerves II-XII intact, strength grossly intact, sensation intact to light touch.  Lab Results: Basic Metabolic Panel:  Recent Labs Lab 07/11/13 2030 07/12/13 0515  NA 134* 139  K 4.4 5.3  CL 100 103  CO2 19 19  GLUCOSE 289* 300*  BUN 7 8  CREATININE 0.90 0.87  CALCIUM 8.1* 8.5   CBC:  Recent Labs Lab 07/10/13 1316  WBC 4.3  NEUTROABS 2.2  HGB 14.8  HCT 41.6  MCV 87.4  PLT 372   CBG:  Recent Labs Lab 07/11/13 1206 07/11/13 1320 07/11/13 1434 07/11/13 1643 07/11/13 2117 07/12/13 0821  GLUCAP 155* 149* 128* 208* 286* 310*   Urinalysis:  Recent Labs Lab 07/10/13 1146  COLORURINE YELLOW  LABSPEC 1.035*  PHURINE 5.5  GLUCOSEU >1000*  HGBUR NEGATIVE  BILIRUBINUR  NEGATIVE  KETONESUR >80*  PROTEINUR NEGATIVE  UROBILINOGEN 0.2  NITRITE NEGATIVE  LEUKOCYTESUR NEGATIVE   Micro Results: Recent Results (from the past 240 hour(s))  URINE CULTURE     Status: None   Collection Time    07/10/13 11:46 AM      Result Value Ref Range Status   Specimen Description URINE, CLEAN CATCH   Final   Special Requests NONE   Final   Culture  Setup Time     Final   Value: 07/10/2013 12:15     Performed at [facility]   [place] Count     Final   Value: 70,000 COLONIES/ML     [facility]formed at [facility]   Culture     Final   Value: ESCHERICHIA COLI     [facility]formed at [facility]   Report Status 07/12/2013 FINAL   Final   Organism ID, Bacteria ESCHERICHIA COLI   Final   Studies/Results: No results found. Medications: I have reviewed the patient's current medications. Scheduled Meds: . atorvastatin  20 mg Oral q1800  . enoxaparin (LOVENOX) injection  40 mg Subcutaneous Q24H  . insulin aspart  0-15 Units Subcutaneous TID WC  . insulin glargine  25 Units Subcutaneous Daily  . pantoprazole  40 mg Oral Daily  . polyethylene glycol  17 g Oral Daily  . senna-docusate  1 tablet Oral BID   Continuous Infusions:   PRN Meds:.dextrose  Assessment/Plan: [name] is a 63 y.o. male w/ PMHx of HTN, HLD, DM type II, GERD, and h/o CVA (2009), admitted for mild DKA.  DM type II w/ DKA- Slowly resolving. Most recent HCO3 of 19, AG of 17. CBG's in the 300's. AG and CBG's have worsened since yesterday, however, patient says he feels okay. Still with some mild dizziness. Orthostatic vital signs positive. -1L NS bolus; then NS @ 100 ml/hr -Increase Lantus to 25 units qd -ISS-M + CBG's AC/HS -Continue Carb modified diet -Repeat BMP in PM to reassess AG  Constipation- Still without BM. S/p Miralax x3 + Senekot. -Soap Suds enema -IVF's as above  -Miralax + Senekot  HTN- Patient normotensive. Takes Norvasc 5 mg po qd + Coreg 25 mg po bid at  home.  -Continue to hold home meds.   HLD- No lipid panel on file. On Crestor 10 mg po qhs at home.  -Restarted Lipitor 20 mg qhs  GERD- On Nexium 40 mg po qd at home.  -Protonix 40 mg po qd  DVT/PE PPx- Lovenox [place]  Dispo: Disposition is deferred at this time, awaiting improvement of current medical problems.  Anticipated discharge today.  The patient does have a current PCP Jeremy Ora, MD) and does not need an [facility] hospital follow-up appointment after discharge.  The patient does not have transportation limitations that hinder transportation to clinic appointments.  .Services Needed at time of discharge: Y = Yes, Blank = No PT:   OT:   RN:   Equipment:   Other:     LOS: 2 days   Courtney Paris, MD 07/12/2013, 12:36 PM

## 2013-07-12 NOTE — Discharge Instructions (Signed)
1. You have a follow up appointment scheduled as follows:  Jeremy Farmer  On 07/20/2013 @ 10:30 AM  [address] [place] [address] [phone]  2. Please take all medications as prescribed. Please take Metformin 1000 mg twice daily + Lantus 30 units daily.   CHECK YOUR BLOOD SUGAR before going to your follow up appointment.   If CBG <100, inject 20 units Lantus.  Please take Miralax + Senekot twice daily if needed for constipation.   3. If you have worsening of your symptoms or new symptoms arise, please call the clinic ([phone]), or go to the ER immediately if symptoms are severe.  You have done a great job in taking all your medications. I appreciate it very much. Please continue doing that.   Diabetic Ketoacidosis Diabetic ketoacidosis (DKA) is a life-threatening complication of type 1 diabetes. It must be quickly recognized and treated. Treatment requires hospitalization. CAUSES  When there is no insulin in the body, glucose (sugar) cannot be used and the body breaks down fat for energy. When fat breaks down, acids (ketones) build up in the blood. Very high levels of glucose and high levels of acids lead to severe loss of body fluids (dehydration) and other dangerous chemical changes. This stresses your vital organs and can cause coma or death. SYMPTOMS   Tiredness (fatigue).  Weight loss.  Excessive thirst.  Ketones in the urine.  Lightheadedness.  Fruity or sweet smell on your breath.  Excessive urination.  Visual changes.  Confusion or irritability.  Feeling sick to your stomach (nauseous) or vomiting.  Rapid breathing.  Stomachache or belly (abdominal) pain. DIAGNOSIS  Your caregiver will diagnose DKA based on your history, physical exam, and blood tests. Your caregiver will check if there is another illness present which caused you to go into DKA. Most of this will be done quickly in an emergency room. TREATMENT   Fluid replacement to  correct dehydration.  Insulin.  Correction of electrolytes, such as potassium and sodium.  Medicines (antibiotics) that kill germs for infections. PREVENTION  Always take your insulin. Do not skip your insulin injections.  If you are ill, treat yourself quickly. Your body often needs more insulin to fight the illness.  Check your blood glucose regularly.  Check urine ketones if your blood glucose is greater than 240 milligrams per deciliter (mg/dl).  Do not used expired or outdated insulin.  If your blood glucose is high, drink plenty of fluids. This helps flush out ketones. HOME CARE INSTRUCTIONS   If you are ill, follow the advice of your caregiver.  To prevent loss of body fluids (dehydration), drink enough water and fluids to keep your urine clear or pale yellow.  If you cannot eat, alternate between drinking fluids with sugar (soda, juices, flavored gelatin) and salty fluids (broth, bouillon).  If you can eat, follow your usual diet and drink sugar-free liquids (water, diet drinks).  Always take your usual dose of insulin. If you cannot eat, or your glucose is getting too low, call your caregiver for further instructions.  Continue to monitor your blood or urine ketones every 3 to 4 hours around the clock. Set your alarm clock or have someone wake you up. If you are too sick, have someone test it for you.  Rest and avoid exercise. SEEK MEDICAL CARE IF:   You have ketones in your urine or your blood glucose is higher than a level your caregiver suggests. You may need extra insulin. Call your caregiver  if you need advice on adjusting your insulin.  You cannot drink at least a tablespoon of fluid every 15 to 20 minutes.  You have been throwing up for more than 2 hours.  You have symptoms of DKA:  Fruity smelling breath.  Breathing faster or slower.  Becoming very sleepy. SEEK IMMEDIATE MEDICAL CARE IF:   You have signs of dehydration:  Decreased  urination.  Increased thirst.  Dry skin and mouth.  Lightheadedness.  Your blood glucose is very high (as advised by your caregiver) twice in a row.  You or your child has an oral temperature above 102 F (38.9 C), not controlled by medicine.  You pass out.  You have chest pain and/or trouble breathing.  You have a sudden, severe headache.  You have sudden weakness in one arm and/or one leg.  You have sudden difficulty speaking and/or swallowing.  You develop vomiting and/or diarrhea that is getting worse after 3 to 4 hours.  You have abdominal pain. MAKE SURE YOU:   Understand these instructions.  Will watch your condition.  Will get help right away if you are not doing well or get worse. Document Released: 03/19/2000 Document Revised: 06/14/2011 Document Reviewed: 09/25/2008 [facility] Patient Information 2014 [employer] [place].

## 2013-07-12 NOTE — Progress Notes (Signed)
  Date: 07/12/2013  Patient name: Jeremy Farmer  Medical record number: [id number]  Date of birth: 1951-01-12   This patient has been seen and the plan of care was discussed with the house staff. Please see their note for complete details. I concur with their findings with the following additions/corrections: Other than dizziness, Mr Jeremy Farmer feels great. Gap 17. Bicarb is 19. In March, he had gaps that were comparable. He was orthostatic. He will get one liter. Eating full diet. Likely D/C today.   Burns SpainElizabeth A Halynn Reitano, MD 07/12/2013, 12:46 PM

## 2013-07-16 NOTE — Discharge Summary (Signed)
Name: Jeremy Farmer MRN: [id number] DOB: Feb 18, 1951 63 y.o. PCP: Willow Oraamille Farmer [name], MD  Date of Admission: 07/10/2013 10:51 AM Date of Discharge: 07/12/13 Attending Physician: Dr. Rogelia BogaButcher  Discharge Diagnosis: 1. DKA 2. Constipation 3. HTN  Discharge Medications:   Medication List    STOP taking these medications       carvedilol 25 MG tablet  Commonly known as:  COREG      TAKE these medications       amLODipine 5 MG tablet  Commonly known as:  NORVASC  Take 1 tablet (5 mg total) by mouth daily.     aspirin EC 81 MG tablet  Take 81 mg by mouth every morning.     CRESTOR 10 MG tablet  Generic drug:  rosuvastatin  Take 10 mg by mouth at bedtime.     esomeprazole 40 MG capsule  Commonly known as:  NEXIUM  Take 40 mg by mouth daily before breakfast.     Insulin Glargine 100 UNIT/ML Solostar Pen  Commonly known as:  LANTUS SOLOSTAR  Inject 30 Units into the skin daily at 10 pm.     Insulin Pen Needle 33G X 6 MM Misc  1 each by Does not apply route at bedtime.     metFORMIN 1000 MG tablet  Commonly known as:  GLUCOPHAGE  Take 1,000 mg by mouth 2 (two) times daily with a meal.     polyethylene glycol packet  Commonly known as:  MIRALAX / GLYCOLAX  Take 17 g by mouth daily.     senna-docusate 8.6-50 MG per tablet  Commonly known as:  Senokot-S  Take 1 tablet by mouth 2 (two) times daily.     triamcinolone cream 0.1 %  Commonly known as:  KENALOG  Apply 1 application topically daily as needed. For rash        Disposition and follow-up:   JeremyNeshawn Farmer Arvilla Farmer was discharged from [facility] in Good condition.  At the hospital follow up visit please address:  1.  Blood sugars; Patient discharged on Lantus 30 units + home Metformin 1000 mg bid. Please address need for change in Lantus dose or addition of SS or tid Insulin dosing.  Constipation; patient w/ enema in hospital, removed significant stool burden, sent home with Miralax + Senekot. Is  patient having regular bowel movements?  Blood pressure- Patient normotensive during hospitalization. Previously on Coreg as above, restarted Norvsc prior to discharge. Can restart Coreg as necessary.   2.  Labs / imaging needed at time of follow-up: BMP  3.  Pending labs/ test needing follow-up: none  Follow-up Appointments: Follow-up Information   Follow up with [name],[name] L, MD On 07/20/2013. (10:30 AM)    Specialty:  Family Medicine   Contact information:   [address] [place] [id number] [phone]       Consultations:  none  Procedures Performed:  Dg Chest 2 View  06/15/2013   CLINICAL DATA:  Mid chest pain, hypertension, dizziness  EXAM: CHEST  2 VIEW  COMPARISON:  05/29/2012  FINDINGS: The heart size and mediastinal contours are within normal limits. Both lungs are clear. The visualized skeletal structures are unremarkable.  IMPRESSION: No active cardiopulmonary disease.   Electronically Signed   By: Ruel Favorsrevor  Shick M.D.   On: 06/15/2013 10:54   Dg Abd Acute W/chest  07/10/2013   CLINICAL DATA:  constipation constipation  EXAM: ACUTE ABDOMEN SERIES (ABDOMEN 2 VIEW & CHEST 1 VIEW)  COMPARISON:  None.  FINDINGS: There is no evidence of dilated bowel loops or free intraperitoneal air. No radiopaque calculi or other significant radiographic abnormality is seen. Heart size and mediastinal contours are within normal limits. Both lungs are clear. Moderate to large amount of stool appreciated.  IMPRESSION: Negative abdominal radiographs.  No acute cardiopulmonary disease.  Moderate to large amount of fecal retention.   Electronically Signed   By: Salome HolmesHector  Cooper M.D.   On: 07/10/2013 12:22   Admission HPI:  [name] is a 63 y.o. male w/ PMHx of HTN, HLD, DM type II, GERD, and h/o CVA (2009), presents to the ED w/ complaints of hyperglycemia, w/ associated polyuria, polydipsia and weakness and fatigue. The patient states his blood sugars have been running high over  the past 2-3 weeks, ever since he was discharged from the hospital on 06/16/13, when he was admitted for atypical chest pain. He claims he has been waking up 5-6x nightly to go to the bathroom and says he has been very thirsty as a result. He also admits to recent weakness and fatigue w/ exercise, mild DOE, and dizziness. According to Mr. Arvilla Farmer, he saw his PCP in 03/2013 and his HbA1c was "so low" (6.0), that he was taken off of his Metformin and Lantus at that time. During his last admission on 06/15/13, the patient was shown to have HbA1c of 10.8. He was subsequently sent home on Metformin 1000 mg bid, but claims his blood sugars have still been high at home, as high as 500's.  The patient also endorses constipation for the past 3 weeks w/ associated flatulence as well. He claims he has had 2-3 bowel movements in the past 3 weeks, the last one he described as dark, hard and scant in nature.  He denies any other issues at this time. No abdominal pain, chest pain, dysuria, hematuria, LE swelling, PND, or orthopnea.   Hospital Course by problem list:   1. DKA- Patient complained of recent h/o polyuria, polydipsia, fatigue, and weakness. Admitted w/ CBG of 524, HCO3 of 17 and AG of 21. UA w/ ketones >80. Recently admitted on 06/16/13 for chest pain, found to be hyperglycemic at that time w/ associated polyuria and polydipsia as well. Previous HbA1c of 6.0 in 03/2013 (stopped all oral hypoglycemics + insulin at that time), and repeat on previous admission found to be 10.8. Hyperglycemia and worsening HbA1c at previous admission thought to be 2/2 UTI vs dietary non-compliance. Patient was discharged on 06/17/13 w/ Metformin 1000 mg bid, claims he has been compliant with this. Etiology of DKA during this admission appeared to be 2/2 poor glucose control. Admitted to stepdown on DKA protocol w/ insulin gtt. Symptoms resolved by the next day, put on a carb modifed diet, started on Lantus 20 units + ISS-S w/ CBG's AC/HS.  DKA very slowly resolved, the day of discharge CBG's still in the 300's, however, trended down throughout the course of the day, Lantus increased to 25 units. Patient also claimed to have some dizziness on 07/12/13, orthostatic vital signs found to be positive. Given a 1L NS bolus w/ improvement in symptoms. Restarted Metformin 1000 mg bid + Lantus 30 units at home. Patient at baseline, stable, and ready for discharge.   2. Constipation- Patient claimed he had 2 BM's in the 3 weeks prior to discharge. Says he had been passing lots of flatus, but without frequent bowel movements. For his past stool prior to admission, he claimed he had a small stool, dark in  nature. FOBT negative in ED. Given his recent description of polyuria in the setting of severe hyperglycemia, I suspect these two things are related. Patient claims that when his blood sugar was well controlled, he had regular bowel movements. Abd XR showed moderate to large stool retention, w/out obvious obstruction. Started on Miralax + Senekot w/ no relief. On the day of discharge, given soap suds enema with very large bowel movement and significant relief. Given Miralax + Senekot on discharge.   3. HTN- Patient normotensive on admission. Takes Norvasc 5 mg po qd + Coreg 25 mg po bid at home. Continued to be normotensive during admission until 4/9, patient had elevated BP, restarted Norvasc 5 mg po qd.  Discharge Vitals:   BP 159/92  Pulse 77  Temp(Src) 98.3 F (36.8 C) (Oral)  Resp 20  Ht 5\' 9"  (1.753 m)  Wt 275 lb 9.2 oz (125 kg)  BMI 40.68 kg/m2  SpO2 96%  Discharge Labs:  No results found for this or any previous visit (from the past 24 hour(s)).  Signed: Courtney Paris, MD 07/16/2013, 8:26 PM   Time Spent on Discharge: 35 minutes Services Ordered on Discharge: none Equipment Ordered on Discharge: none

## 2013-11-30 ENCOUNTER — Encounter (HOSPITAL_COMMUNITY): Payer: Self-pay | Admitting: Emergency Medicine

## 2013-11-30 ENCOUNTER — Other Ambulatory Visit: Payer: Self-pay | Admitting: Family Medicine

## 2013-11-30 ENCOUNTER — Emergency Department (HOSPITAL_COMMUNITY)
Admission: EM | Admit: 2013-11-30 | Discharge: 2013-11-30 | Disposition: A | Discharge status: Home / Self Care | Payer: Medicare HMO | Attending: Emergency Medicine | Admitting: Emergency Medicine

## 2013-11-30 ENCOUNTER — Emergency Department (HOSPITAL_COMMUNITY): Payer: Medicare HMO

## 2013-11-30 DIAGNOSIS — E7800 Pure hypercholesterolemia, unspecified: Secondary | ICD-10-CM | POA: Insufficient documentation

## 2013-11-30 DIAGNOSIS — I10 Essential (primary) hypertension: Secondary | ICD-10-CM | POA: Diagnosis not present

## 2013-11-30 DIAGNOSIS — N451 Epididymitis: Secondary | ICD-10-CM

## 2013-11-30 DIAGNOSIS — N453 Epididymo-orchitis: Secondary | ICD-10-CM | POA: Diagnosis not present

## 2013-11-30 DIAGNOSIS — Z79899 Other long term (current) drug therapy: Secondary | ICD-10-CM | POA: Diagnosis not present

## 2013-11-30 DIAGNOSIS — Z7982 Long term (current) use of aspirin: Secondary | ICD-10-CM | POA: Insufficient documentation

## 2013-11-30 DIAGNOSIS — K219 Gastro-esophageal reflux disease without esophagitis: Secondary | ICD-10-CM | POA: Diagnosis not present

## 2013-11-30 DIAGNOSIS — E119 Type 2 diabetes mellitus without complications: Secondary | ICD-10-CM | POA: Insufficient documentation

## 2013-11-30 DIAGNOSIS — Z794 Long term (current) use of insulin: Secondary | ICD-10-CM | POA: Diagnosis not present

## 2013-11-30 DIAGNOSIS — R1032 Left lower quadrant pain: Secondary | ICD-10-CM | POA: Diagnosis not present

## 2013-11-30 DIAGNOSIS — N509 Disorder of male genital organs, unspecified: Secondary | ICD-10-CM

## 2013-11-30 DIAGNOSIS — Z8673 Personal history of transient ischemic attack (TIA), and cerebral infarction without residual deficits: Secondary | ICD-10-CM | POA: Diagnosis not present

## 2013-11-30 LAB — BASIC METABOLIC PANEL
Anion gap: 13 (ref 5–15)
BUN: 13 mg/dL (ref 6–23)
CHLORIDE: 102 meq/L (ref 96–112)
CO2: 26 meq/L (ref 19–32)
CREATININE: 0.91 mg/dL (ref 0.50–1.35)
Calcium: 9.4 mg/dL (ref 8.4–10.5)
GFR calc Af Amer: >90 mL/min (ref >90)
GFR calc non Af Amer: 89 mL/min — ABNORMAL LOW (ref >90)
GLUCOSE: 95 mg/dL (ref 70–99)
Potassium: 5.1 mEq/L (ref 3.7–5.3)
SODIUM: 141 meq/L (ref 137–147)

## 2013-11-30 LAB — URINE MICROSCOPIC-ADD ON

## 2013-11-30 LAB — CBC WITH DIFFERENTIAL/PLATELET
Basophils Absolute: 0 10*3/uL (ref 0.0–0.1)
Basophils Relative: 0 % (ref 0–1)
Eosinophils Absolute: 0.1 10*3/uL (ref 0.0–0.7)
Eosinophils Relative: 1 % (ref 0–5)
HEMATOCRIT: 42.6 % (ref 39.0–52.0)
HEMOGLOBIN: 14.1 g/dL (ref 13.0–17.0)
LYMPHS PCT: 17 % (ref 12–46)
Lymphs Abs: 1.7 10*3/uL (ref 0.7–4.0)
MCH: 30 pg (ref 26.0–34.0)
MCHC: 33.1 g/dL (ref 30.0–36.0)
MCV: 90.6 fL (ref 78.0–100.0)
MONO ABS: 0.8 10*3/uL (ref 0.1–1.0)
MONOS PCT: 8 % (ref 3–12)
NEUTROS ABS: 7.3 10*3/uL (ref 1.7–7.7)
Neutrophils Relative %: 74 % (ref 43–77)
Platelets: 434 10*3/uL — ABNORMAL HIGH (ref 150–400)
RBC: 4.7 MIL/uL (ref 4.22–5.81)
RDW: 12.8 % (ref 11.5–15.5)
WBC: 9.8 10*3/uL (ref 4.0–10.5)

## 2013-11-30 LAB — URINALYSIS, ROUTINE W REFLEX MICROSCOPIC
BILIRUBIN URINE: NEGATIVE
Glucose, UA: NEGATIVE mg/dL
Ketones, ur: NEGATIVE mg/dL
NITRITE: NEGATIVE
PH: 8 (ref 5.0–8.0)
Protein, ur: 30 mg/dL — AB
Specific Gravity, Urine: 1.014 (ref 1.005–1.030)
Urobilinogen, UA: 0.2 mg/dL (ref 0.0–1.0)

## 2013-11-30 MED ORDER — MORPHINE SULFATE 4 MG/ML IJ SOLN
6.0000 mg | Freq: Once | INTRAMUSCULAR | Status: AC
  Administered 2013-11-30: 6 mg via INTRAVENOUS
  Filled 2013-11-30: qty 2

## 2013-11-30 MED ORDER — CEFTRIAXONE SODIUM 1 G IJ SOLR
1.0000 g | Freq: Once | INTRAMUSCULAR | Status: AC
  Administered 2013-11-30: 1 g via INTRAVENOUS
  Filled 2013-11-30: qty 10

## 2013-11-30 MED ORDER — OXYCODONE-ACETAMINOPHEN 5-325 MG PO TABS: 1.0000 | ORAL_TABLET | Freq: Four times a day (QID) | ORAL | Status: DC | PRN

## 2013-11-30 MED ORDER — SULFAMETHOXAZOLE-TMP DS 800-160 MG PO TABS
1.0000 | ORAL_TABLET | Freq: Once | ORAL | Status: AC
Start: 2013-11-30 — End: 2013-11-30
  Administered 2013-11-30: 1 via ORAL
  Filled 2013-11-30: qty 1

## 2013-11-30 MED ORDER — SULFAMETHOXAZOLE-TRIMETHOPRIM 800-160 MG PO TABS: 1.0000 | ORAL_TABLET | Freq: Two times a day (BID) | ORAL | Status: DC

## 2013-11-30 NOTE — ED Notes (Signed)
Ultrasound at bedside

## 2013-11-30 NOTE — ED Provider Notes (Signed)
CSN: [id number]     Arrival date & time 11/30/13  1221 History   First MD Initiated Contact with Patient 11/30/13 1329     Chief Complaint  Patient presents with  . Testicle Pain  . Abdominal Pain     (Consider location/radiation/quality/duration/timing/severity/associated sxs/prior Treatment) HPI Complainsof  left-sided testicle pain and left-sided abdominal pain with slight pain and left flank onset upon awakening this morning. Possibly 1 AM today. Patient seen at [facility] office this morning. Sent here for further evaluation. Scrotal ultrasound recommended. Had urinalysis which was normal. Past Medical History  Diagnosis Date  . Hypertension   . High cholesterol   . Type II diabetes mellitus   . GERD (gastroesophageal reflux disease)   . Stroke 2009    "mini stroke; denies residual on 06/15/2013)   Past Surgical History  Procedure Laterality Date  . Shoulder open rotator cuff repair Right 20069   history of present illness continued. No nausea or vomiting. Last bowel movement yesterday normal. Pain at left testicle worse with pressing on the area or with significance clothing. History reviewed. No pertinent family history. History  Substance Use Topics  . Smoking status: Never Smoker   . Smokeless tobacco: Never Used  . Alcohol Use: No    Review of Systems  Constitutional: Negative.   HENT: Negative.   Respiratory: Negative.   Cardiovascular: Negative.   Gastrointestinal: Positive for abdominal pain.  Genitourinary: Positive for testicular pain.  Musculoskeletal: Negative.   Skin: Negative.   Allergic/Immunologic: Positive for immunocompromised state.       Diabetic  Neurological: Negative.   Psychiatric/Behavioral: Negative.   All other systems reviewed and are negative.     Allergies  Peanut-containing drug products  Home Medications   Prior to Admission medications   Medication Sig Start Date End Date Taking? Authorizing Provider  amLODipine (NORVASC) 5  MG tablet Take 1 tablet (5 mg total) by mouth daily. 07/12/13   Courtney Paris, MD  aspirin EC 81 MG tablet Take 81 mg by mouth every morning.    Historical Provider, MD  CRESTOR 10 MG tablet Take 10 mg by mouth at bedtime. 06/13/13   Historical Provider, MD  esomeprazole (NEXIUM) 40 MG capsule Take 40 mg by mouth daily before breakfast.    Historical Provider, MD  Insulin Glargine (LANTUS SOLOSTAR) 100 UNIT/ML Solostar Pen Inject 30 Units into the skin daily at 10 pm. 07/12/13   Courtney Paris, MD  Insulin Pen Needle 33G X 6 MM MISC 1 each by Does not apply route at bedtime. 07/12/13   Courtney Paris, MD  metFORMIN (GLUCOPHAGE) 1000 MG tablet Take 1,000 mg by mouth 2 (two) times daily with a meal.    Historical Provider, MD  polyethylene glycol (MIRALAX / GLYCOLAX) packet Take 17 g by mouth daily. 07/12/13   Courtney Paris, MD  senna-docusate (SENOKOT-S) 8.6-50 MG per tablet Take 1 tablet by mouth 2 (two) times daily. 07/12/13   Courtney Paris, MD  triamcinolone cream (KENALOG) 0.1 % Apply 1 application topically daily as needed. For rash 03/16/13   Historical Provider, MD   BP 121/69  Pulse 60  Temp(Src) 99 F (37.2 C)  Resp 16  SpO2 97% Physical Exam  Nursing note and vitals reviewed. Constitutional: He appears well-developed and well-nourished.  HENT:  Head: Normocephalic and atraumatic.  Eyes: Conjunctivae are normal. Pupils are equal, round, and reactive to light.  Neck: Neck supple. No tracheal deviation present. No thyromegaly present.  Cardiovascular: Normal  rate and regular rhythm.   No murmur heard. Pulmonary/Chest: Effort normal and breath sounds normal.  Abdominal: Soft. Bowel sounds are normal. He exhibits no distension. There is no tenderness.  Obese minimally tender left lower quadrant  Genitourinary: Penis normal.  Exquisitely tender left posterior testicle. Scrotum does not appear red warm or swollen. Right testicle normal  Musculoskeletal: Normal range of motion. He exhibits no edema and  no tenderness.  Neurological: He is alert. Coordination normal.  Skin: Skin is warm and dry. No rash noted.  Psychiatric: He has a normal mood and affect.    ED Course  Procedures (including critical care time) Labs Review Labs Reviewed - No data to display  Imaging Review No results found.   EKG Interpretation None     3:20 PM patient feels much improved after treatment with intravenous antibiotics, and opioids. He does her to home. Results for orders placed during the hospital encounter of 11/30/13  BASIC METABOLIC PANEL      Result Value Ref Range   Sodium 141  137 - 147 mEq/L   Potassium 5.1  3.7 - 5.3 mEq/L   Chloride 102  96 - 112 mEq/L   CO2 26  19 - 32 mEq/L   Glucose, Bld 95  70 - 99 mg/dL   BUN 13  6 - 23 mg/dL   Creatinine, Ser 1.61  0.50 - 1.35 mg/dL   Calcium 9.4  8.4 - 09.6 mg/dL   GFR calc non Af Amer 89 (*) >90 mL/min   GFR calc Af Amer >90  >90 mL/min   Anion gap 13  5 - 15  CBC WITH DIFFERENTIAL      Result Value Ref Range   WBC 9.8  4.0 - 10.5 K/uL   RBC 4.70  4.22 - 5.81 MIL/uL   Hemoglobin 14.1  13.0 - 17.0 g/dL   HCT 04.5  40.9 - 81.1 %   MCV 90.6  78.0 - 100.0 fL   MCH 30.0  26.0 - 34.0 pg   MCHC 33.1  30.0 - 36.0 g/dL   RDW 91.4  78.2 - 95.6 %   Platelets 434 (*) 150 - 400 K/uL   Neutrophils Relative % 74  43 - 77 %   Neutro Abs 7.3  1.7 - 7.7 K/uL   Lymphocytes Relative 17  12 - 46 %   Lymphs Abs 1.7  0.7 - 4.0 K/uL   Monocytes Relative 8  3 - 12 %   Monocytes Absolute 0.8  0.1 - 1.0 K/uL   Eosinophils Relative 1  0 - 5 %   Eosinophils Absolute 0.1  0.0 - 0.7 K/uL   Basophils Relative 0  0 - 1 %   Basophils Absolute 0.0  0.0 - 0.1 K/uL  URINALYSIS, ROUTINE W REFLEX MICROSCOPIC      Result Value Ref Range   Color, Urine YELLOW  YELLOW   APPearance CLOUDY (*) CLEAR   Specific Gravity, Urine 1.014  1.005 - 1.030   pH 8.0  5.0 - 8.0   Glucose, UA NEGATIVE  NEGATIVE mg/dL   Hgb urine dipstick MODERATE (*) NEGATIVE   Bilirubin Urine  NEGATIVE  NEGATIVE   Ketones, ur NEGATIVE  NEGATIVE mg/dL   Protein, ur 30 (*) NEGATIVE mg/dL   Urobilinogen, UA 0.2  0.0 - 1.0 mg/dL   Nitrite NEGATIVE  NEGATIVE   Leukocytes, UA MODERATE (*) NEGATIVE  URINE MICROSCOPIC-ADD ON      Result Value Ref Range   Squamous Epithelial /  LPF RARE  RARE   WBC, UA TOO NUMEROUS TO COUNT  <3 WBC/hpf   RBC / HPF 7-10  <3 RBC/hpf   Bacteria, UA RARE  RARE   US Scrotum  11/30/2013   CLINICAL DATA:  Left testicle pain.  EXAM: SCROTAL ULTRASOUND  DOPPLER ULTRASOUND OF THE TESTICLES  TECHNIQUE: Complete ultrasound examination of the testicles, epididymis, and other scrotal structures was performed. Color and spectral Doppler ultrasound were also utilized to evaluate blood flow to the testicles.  COMPARISON:  None.  FINDINGS: Right testicle  Measurements: 4.3 x 2.7 x 3.7 cm. Extensive microlithiasis. Normal perfusion.  Left testicle  Measurements: 4.4 x 2.4 x 3.5 cm. Extensive microlithiasis. Increased vascularity of the left testicle.  Right epididymis:  Normal in size and appearance.  Left epididymis: Marked enlargement and marked hypervascularity of the left epididymis. 3 mm cyst in the epididymal head.  Hydrocele:  Small bilateral hydroceles.  Varicocele:  None visualized.  Pulsed Doppler interrogation of both testes demonstrates low resistance arterial and venous waveforms bilaterally.  There is diffuse thickening of the skin of the scrotum.  IMPRESSION: 1. Left epididymo-orchitis. 2. 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   Electronically Signed   By: Geanie Cooley M.D.   On: 11/30/2013 14:24   [place] Art/ven Flow Abd Pelv Doppler  11/30/2013   CLINICAL DATA:  Left testicle pain.  EXAM: SCROTAL ULTRASOUND  DOPPLER ULTRASOUND OF THE TESTICLES  TECHNIQUE: Complete ultrasound examination of the testicles, epididymis, and other scrotal structures was performed. Color and spectral Doppler ultrasound were also utilized to evaluate blood flow to the  testicles.  COMPARISON:  None.  FINDINGS: Right testicle  Measurements: 4.3 x 2.7 x 3.7 cm. Extensive microlithiasis. Normal perfusion.  Left testicle  Measurements: 4.4 x 2.4 x 3.5 cm. Extensive microlithiasis. Increased vascularity of the left testicle.  Right epididymis:  Normal in size and appearance.  Left epididymis: Marked enlargement and marked hypervascularity of the left epididymis. 3 mm cyst in the epididymal head.  Hydrocele:  Small bilateral hydroceles.  Varicocele:  None visualized.  Pulsed Doppler interrogation of both testes demonstrates low resistance arterial and venous waveforms bilaterally.  There is diffuse thickening of the skin of the scrotum.  IMPRESSION: 1. Left epididymo-orchitis. 2. 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   Electronically Signed   By: Geanie Cooley M.D.   On: 11/30/2013 14:24    MDM  Spoke with Dr.Ottelin Plan prescription Septra DS, Percocet followup in office next week Final diagnoses:  None   Dx :left epididymoorchitis     Doug Sou, MD 11/30/13 9147

## 2013-11-30 NOTE — Discharge Instructions (Signed)
Epididymitis Take Tylenol for mild pain or the pain medicine prescribed for bad pain. Call Dr.Ottelin's office on 12/03/2013 to schedule the next available office appointment Epididymitis is a swelling (inflammation) of the epididymis. The epididymis is a cord-like structure along the back part of the testicle. Epididymitis is usually, but not always, caused by infection. This is usually a sudden problem beginning with chills, fever and pain behind the scrotum and in the testicle. There may be swelling and redness of the testicle. DIAGNOSIS  Physical examination will reveal a tender, swollen epididymis. Sometimes, cultures are obtained from the urine or from prostate secretions to help find out if there is an infection or if the cause is a different problem. Sometimes, blood work is performed to see if your white blood cell count is elevated and if a germ (bacterial) or viral infection is present. Using this knowledge, an appropriate medicine which kills germs (antibiotic) can be chosen by your caregiver. A viral infection causing epididymitis will most often go away (resolve) without treatment. HOME CARE INSTRUCTIONS   Hot sitz baths for 20 minutes, 4 times per day, may help relieve pain.  Only take over-the-counter or prescription medicines for pain, discomfort or fever as directed by your caregiver.  Take all medicines, including antibiotics, as directed. Take the antibiotics for the full prescribed length of time even if you are feeling better.  It is very important to keep all follow-up appointments. SEEK IMMEDIATE MEDICAL CARE IF:   You have a fever.  You have pain not relieved with medicines.  You have any worsening of your problems.  Your pain seems to come and go.  You develop pain, redness, and swelling in the scrotum and surrounding areas. MAKE SURE YOU:   Understand these instructions.  Will watch your condition.  Will get help right away if you are not doing well or get  worse. Document Released: 03/19/2000 Document Revised: 06/14/2011 Document Reviewed: 02/06/2009 [facility] Patient Information 2015 [place], [place]. This information is not intended to replace advice given to you by your health care provider. Make sure you discuss any questions you have with your health care provider.

## 2013-11-30 NOTE — ED Notes (Signed)
Pt c/o pain. Made Dr. Ethelda Chick aware. New orders to be placed.

## 2013-11-30 NOTE — ED Notes (Addendum)
Per pt, woke up at 1 am with lower left abdominal pain radiating to testicle.  Some pain in lower flank.  Pt having left testicular swelling/pain also.  Pt went to MD and sent here.  Pt had blood in urine at MD office.  No n/v/d.  No fever.  Some difficulty with urination.

## 2013-12-01 LAB — URINE CULTURE
COLONY COUNT: NO GROWTH
Culture: NO GROWTH

## 2013-12-04 ENCOUNTER — Other Ambulatory Visit: Payer: Medicare Other

## 2014-06-12 ENCOUNTER — Encounter (HOSPITAL_COMMUNITY): Payer: Self-pay | Admitting: *Deleted

## 2014-06-12 ENCOUNTER — Emergency Department (HOSPITAL_COMMUNITY): Payer: Commercial Managed Care - HMO

## 2014-06-12 ENCOUNTER — Emergency Department (HOSPITAL_COMMUNITY)
Admission: EM | Admit: 2014-06-12 | Discharge: 2014-06-12 | Disposition: A | Discharge status: Home / Self Care | Payer: Commercial Managed Care - HMO | Attending: Emergency Medicine | Admitting: Emergency Medicine

## 2014-06-12 DIAGNOSIS — I10 Essential (primary) hypertension: Secondary | ICD-10-CM | POA: Diagnosis not present

## 2014-06-12 DIAGNOSIS — R05 Cough: Secondary | ICD-10-CM | POA: Insufficient documentation

## 2014-06-12 DIAGNOSIS — Z79899 Other long term (current) drug therapy: Secondary | ICD-10-CM | POA: Insufficient documentation

## 2014-06-12 DIAGNOSIS — K219 Gastro-esophageal reflux disease without esophagitis: Secondary | ICD-10-CM | POA: Insufficient documentation

## 2014-06-12 DIAGNOSIS — R091 Pleurisy: Secondary | ICD-10-CM

## 2014-06-12 DIAGNOSIS — J069 Acute upper respiratory infection, unspecified: Secondary | ICD-10-CM | POA: Diagnosis not present

## 2014-06-12 DIAGNOSIS — R079 Chest pain, unspecified: Secondary | ICD-10-CM | POA: Diagnosis present

## 2014-06-12 DIAGNOSIS — E780 Pure hypercholesterolemia: Secondary | ICD-10-CM | POA: Insufficient documentation

## 2014-06-12 DIAGNOSIS — Z794 Long term (current) use of insulin: Secondary | ICD-10-CM | POA: Insufficient documentation

## 2014-06-12 DIAGNOSIS — R0602 Shortness of breath: Secondary | ICD-10-CM | POA: Diagnosis not present

## 2014-06-12 DIAGNOSIS — Z792 Long term (current) use of antibiotics: Secondary | ICD-10-CM | POA: Insufficient documentation

## 2014-06-12 DIAGNOSIS — E119 Type 2 diabetes mellitus without complications: Secondary | ICD-10-CM | POA: Insufficient documentation

## 2014-06-12 DIAGNOSIS — Z8673 Personal history of transient ischemic attack (TIA), and cerebral infarction without residual deficits: Secondary | ICD-10-CM | POA: Diagnosis not present

## 2014-06-12 DIAGNOSIS — Z7982 Long term (current) use of aspirin: Secondary | ICD-10-CM | POA: Diagnosis not present

## 2014-06-12 DIAGNOSIS — J399 Disease of upper respiratory tract, unspecified: Secondary | ICD-10-CM

## 2014-06-12 DIAGNOSIS — R059 Cough, unspecified: Secondary | ICD-10-CM

## 2014-06-12 LAB — BASIC METABOLIC PANEL
Anion gap: 4 — ABNORMAL LOW (ref 5–15)
BUN: 16 mg/dL (ref 6–23)
CALCIUM: 8.6 mg/dL (ref 8.4–10.5)
CHLORIDE: 108 mmol/L (ref 96–112)
CO2: 29 mmol/L (ref 19–32)
CREATININE: 1.08 mg/dL (ref 0.50–1.35)
GFR calc Af Amer: 83 mL/min — ABNORMAL LOW (ref >90)
GFR calc non Af Amer: 72 mL/min — ABNORMAL LOW (ref >90)
Glucose, Bld: 102 mg/dL — ABNORMAL HIGH (ref 70–99)
Potassium: 4.5 mmol/L (ref 3.5–5.1)
Sodium: 141 mmol/L (ref 135–145)

## 2014-06-12 LAB — I-STAT TROPONIN, ED
TROPONIN I, POC: 0.01 ng/mL (ref 0.00–0.08)
Troponin i, poc: 0 ng/mL (ref 0.00–0.08)

## 2014-06-12 LAB — CBC
HCT: 42.4 % (ref 39.0–52.0)
Hemoglobin: 14.2 g/dL (ref 13.0–17.0)
MCH: 30.9 pg (ref 26.0–34.0)
MCHC: 33.5 g/dL (ref 30.0–36.0)
MCV: 92.2 fL (ref 78.0–100.0)
Platelets: 374 10*3/uL (ref 150–400)
RBC: 4.6 MIL/uL (ref 4.22–5.81)
RDW: 13.6 % (ref 11.5–15.5)
WBC: 5.2 10*3/uL (ref 4.0–10.5)

## 2014-06-12 LAB — BRAIN NATRIURETIC PEPTIDE: B Natriuretic Peptide: 75.4 pg/mL (ref 0.0–100.0)

## 2014-06-12 MED ORDER — KETOROLAC TROMETHAMINE 60 MG/2ML IM SOLN
30.0000 mg | Freq: Once | INTRAMUSCULAR | Status: AC
  Administered 2014-06-12: 30 mg via INTRAMUSCULAR
  Filled 2014-06-12: qty 2

## 2014-06-12 MED ORDER — HYDROCOD POLST-CHLORPHEN POLST 10-8 MG/5ML PO LQCR: 5.0000 mL | Freq: Two times a day (BID) | ORAL | Status: DC | PRN

## 2014-06-12 MED ORDER — IPRATROPIUM-ALBUTEROL 0.5-2.5 (3) MG/3ML IN SOLN
3.0000 mL | Freq: Once | RESPIRATORY_TRACT | Status: AC
Start: 2014-06-12 — End: 2014-06-12
  Administered 2014-06-12: 3 mL via RESPIRATORY_TRACT
  Filled 2014-06-12: qty 3

## 2014-06-12 MED ORDER — KETOROLAC TROMETHAMINE 30 MG/ML IJ SOLN: 30.0000 mg | Freq: Once | INTRAMUSCULAR | Status: DC

## 2014-06-12 NOTE — Discharge Instructions (Signed)
Cough, Adult  A cough is a reflex that helps clear your throat and airways. It can help heal the body or may be a reaction to an irritated airway. A cough may only last 2 or 3 weeks (acute) or may last more than 8 weeks (chronic).  CAUSES Acute cough:  Viral or bacterial infections. Chronic cough:  Infections.  Allergies.  Asthma.  Post-nasal drip.  Smoking.  Heartburn or acid reflux.  Some medicines.  Chronic lung problems (COPD).  Cancer. SYMPTOMS   Cough.  Fever.  Chest pain.  Increased breathing rate.  High-pitched whistling sound when breathing (wheezing).  Colored mucus that you cough up (sputum). TREATMENT   A bacterial cough may be treated with antibiotic medicine.  A viral cough must run its course and will not respond to antibiotics.  Your caregiver may recommend other treatments if you have a chronic cough. HOME CARE INSTRUCTIONS   Only take over-the-counter or prescription medicines for pain, discomfort, or fever as directed by your caregiver. Use cough suppressants only as directed by your caregiver.  Use a cold steam vaporizer or humidifier in your bedroom or home to help loosen secretions.  Sleep in a semi-upright position if your cough is worse at night.  Rest as needed.  Stop smoking if you smoke. SEEK IMMEDIATE MEDICAL CARE IF:   You have pus in your sputum.  Your cough starts to worsen.  You cannot control your cough with suppressants and are losing sleep.  You begin coughing up blood.  You have difficulty breathing.  You develop pain which is getting worse or is uncontrolled with medicine.  You have a fever. MAKE SURE YOU:   Understand these instructions.  Will watch your condition.  Will get help right away if you are not doing well or get worse. Document Released: 09/18/2010 Document Revised: 06/14/2011 Document Reviewed: 09/18/2010 [employer] Patient Information 2015 [employer]. This information is not intended  to replace advice given to you by your health care provider. Make sure you discuss any questions you have with your health care provider.  

## 2014-06-12 NOTE — ED Notes (Signed)
Pt arrived by [facility] from dr office. Pt reports right side chest pains and left shoulder pain that started yesterday after heavy lifting. Pt having recent cough, reports it being nonproductive. Pain increases with movement and coughing.

## 2014-06-12 NOTE — ED Provider Notes (Signed)
CSN: [id number]     Arrival date & time 06/12/14  1244 History   First MD Initiated Contact with Patient 06/12/14 1643     Chief Complaint  Patient presents with  . Chest Pain  . Shortness of Breath     (Consider location/radiation/quality/duration/timing/severity/associated sxs/prior Treatment) Patient is a 64 y.o. male presenting with chest pain.  Chest Pain Pain location:  Substernal area Pain quality: sharp   Pain radiates to:  L shoulder Pain radiates to the back: no   Pain severity:  Moderate Onset quality:  Gradual Duration:  1 day Timing:  Constant Progression:  Unchanged Chronicity:  New Context: lifting   Context comment:  Cough x 2 days Relieved by:  Nothing Worsened by:  Coughing, deep breathing and movement Ineffective treatments:  None tried Associated symptoms: cough   Associated symptoms: no abdominal pain, no lower extremity edema, no nausea, no shortness of breath and not vomiting     Past Medical History  Diagnosis Date  . Hypertension   . High cholesterol   . Type II diabetes mellitus   . GERD (gastroesophageal reflux disease)   . Stroke 2009    "mini stroke; denies residual on 06/15/2013)   Past Surgical History  Procedure Laterality Date  . Shoulder open rotator cuff repair Right 20069   History reviewed. No pertinent family history. History  Substance Use Topics  . Smoking status: Never Smoker   . Smokeless tobacco: Never Used  . Alcohol Use: No    Review of Systems  Respiratory: Positive for cough. Negative for shortness of breath.   Cardiovascular: Positive for chest pain.  Gastrointestinal: Negative for nausea, vomiting and abdominal pain.  All other systems reviewed and are negative.     Allergies  Peanut-containing drug products  Home Medications   Prior to Admission medications   Medication Sig Start Date End Date Taking? Authorizing Provider  amLODipine (NORVASC) 5 MG tablet Take 5 mg by mouth daily.   Yes Historical  Provider, MD  aspirin EC 81 MG tablet Take 81 mg by mouth every morning.   Yes Historical Provider, MD  CRESTOR 20 MG tablet Take 20 mg by mouth every morning. 04/29/14  Yes Historical Provider, MD  esomeprazole (NEXIUM) 40 MG capsule Take 40 mg by mouth daily before breakfast.   Yes Historical Provider, MD  Insulin Glargine (LANTUS SOLOSTAR) 100 UNIT/ML Solostar Pen Inject 20 Units into the skin every morning.    Yes Historical Provider, MD  metFORMIN (GLUCOPHAGE) 500 MG tablet Take 250 mg by mouth 2 (two) times daily with a meal.   Yes Historical Provider, MD  polyethylene glycol (MIRALAX / GLYCOLAX) packet Take 17 g by mouth daily.   Yes Historical Provider, MD  rosuvastatin (CRESTOR) 10 MG tablet Take 10 mg by mouth daily.   Yes Historical Provider, MD  sennosides-docusate sodium (SENOKOT-S) 8.6-50 MG tablet Take 1 tablet by mouth 2 (two) times daily.   Yes Historical Provider, MD  triamcinolone cream (KENALOG) 0.1 % Apply 1 application topically daily as needed. For rash 03/16/13  Yes Historical Provider, MD  chlorpheniramine-HYDROcodone (TUSSIONEX PENNKINETIC ER) 10-8 MG/5ML LQCR Take 5 mLs by mouth every 12 (twelve) hours as needed for cough. 06/12/14   Mirian MoMatthew Jovanne Riggenbach, MD  oxyCODONE-acetaminophen (PERCOCET) 5-325 MG per tablet Take 1-2 tablets by mouth every 6 (six) hours as needed for severe pain. 11/30/13   Doug SouSam Jacubowitz, MD  sulfamethoxazole-trimethoprim (SEPTRA DS) 800-160 MG per tablet Take 1 tablet by mouth every 12 (twelve) hours. 11/30/13  Doug Sou, MD   BP 114/60 mmHg  Pulse 65  Temp(Src) 99.5 F (37.5 C) (Oral)  Resp 20  SpO2 96% Physical Exam  Constitutional: He is oriented to person, place, and time. He appears well-developed and well-nourished.  HENT:  Head: Normocephalic and atraumatic.  Eyes: Conjunctivae and EOM are normal.  Neck: Normal range of motion. Neck supple.  Cardiovascular: Normal rate, regular rhythm and normal heart sounds.   Pulmonary/Chest: Effort  normal and breath sounds normal. No respiratory distress. He exhibits tenderness.    Abdominal: He exhibits no distension. There is no tenderness. There is no rebound and no guarding.  Musculoskeletal: Normal range of motion.  Neurological: He is alert and oriented to person, place, and time.  Skin: Skin is warm and dry.  Vitals reviewed.   ED Course  Procedures (including critical care time) Labs Review Labs Reviewed  BASIC METABOLIC PANEL - Abnormal; Notable for the following:    Glucose, Bld 102 (*)    GFR calc non Af Amer 72 (*)    GFR calc Af Amer 83 (*)    Anion gap 4 (*)    All other components within normal limits  CBC  BRAIN NATRIURETIC PEPTIDE  I-STAT TROPOININ, ED  I-STAT TROPOININ, ED    Imaging Review Dg Chest 2 View  06/12/2014   CLINICAL DATA:  Chest pain, left shoulder pain starting yesterday after lifting  EXAM: CHEST  2 VIEW  COMPARISON:  07/10/2013  FINDINGS: Cardiomediastinal silhouette is stable. No acute infiltrate or pleural effusion. No pulmonary edema. Mild infrahilar bronchitic changes.  IMPRESSION: No acute infiltrate or pulmonary edema. Mild infrahilar bronchitic changes.   Electronically Signed   By: Natasha Mead M.D.   On: 06/12/2014 14:38     EKG Interpretation   Date/Time:  Wednesday June 12 2014 12:48:51 EST Ventricular Rate:  67 PR Interval:  138 QRS Duration: 76 QT Interval:  378 QTC Calculation: 399 R Axis:   38 Text Interpretation:  Normal sinus rhythm Nonspecific T wave abnormality  Abnormal ECG No significant change since last tracing Confirmed by Mirian Mo [phone]) on 06/12/2014 5:02:12 PM      MDM   Final diagnoses:  Pleurisy  Cough  Upper respiratory disease    64 y.o. male with pertinent PMH of HTN, DM, HLD presents with cough, pleurisy as above 2 days. No fevers. Patient was seen by PCP today and due to concern for [facility] for further evaluation. On arrival vital signs and physical exam as above. Patient has  prominent chest wall tenderness, states that symptoms are worse with deep breathing and coughing. No risk factors for DVT or PE.   Negative delta trop.  Symptoms improved after therapy here.  DC home in stable condition to fu with cardiology.  Standard return precautions given.  I have reviewed all laboratory and imaging studies if ordered as above  1. Pleurisy   2. Cough   3. Upper respiratory disease         Mirian Mo, MD 06/13/14 [phone]

## 2014-06-26 ENCOUNTER — Other Ambulatory Visit: Payer: Self-pay | Admitting: Family Medicine

## 2014-06-26 ENCOUNTER — Ambulatory Visit
Admission: RE | Admit: 2014-06-26 | Discharge: 2014-06-26 | Disposition: A | Discharge status: Home / Self Care | Payer: Commercial Managed Care - HMO | Source: Ambulatory Visit | Attending: Family Medicine | Admitting: Family Medicine

## 2014-06-26 DIAGNOSIS — W19XXXA Unspecified fall, initial encounter: Secondary | ICD-10-CM

## 2014-09-10 ENCOUNTER — Other Ambulatory Visit: Payer: Self-pay | Admitting: Family Medicine

## 2014-09-10 DIAGNOSIS — M25512 Pain in left shoulder: Secondary | ICD-10-CM

## 2014-09-25 ENCOUNTER — Ambulatory Visit
Admission: RE | Admit: 2014-09-25 | Discharge: 2014-09-25 | Disposition: A | Discharge status: Home / Self Care | Payer: Commercial Managed Care - HMO | Source: Ambulatory Visit | Attending: Family Medicine | Admitting: Family Medicine

## 2014-09-25 DIAGNOSIS — M25512 Pain in left shoulder: Secondary | ICD-10-CM

## 2014-10-22 ENCOUNTER — Other Ambulatory Visit: Payer: Self-pay

## 2014-10-22 NOTE — Progress Notes (Signed)
Anesthesia Chart Review: Patient is a 64 year old male scheduled for left shoulder arthroscopy with subacromial decompression, rotator cuff repair on 10/31/14 by Dr. Rennis ChrisSupple.  History includes non-smoker, DM2, HTN, GERD, hypercholesterolemia, ITA '09, snoring, ED. He was sent to the ED by Dr. Duanne Guessewey last year on 06/16/13 for chest pain and was seen by cardiologist Dr. Dietrich PatesPaula Ross.  According to her note, she was "not convinced patients symptoms yesterday reflect coronary ischemia." She thought possible GI. She recommended out-patient follow-up and would make decision regarding possible stress test at that time. I don't see that he was ever seen by cardiology since. He was also seen in the ED on 06/13/14 for chest pain related to pleurisy/URI.   PCP is Dr. Maryelizabeth RowanElizabeth Dewey who evaluated patient on 10/14/14 and cleared patient for surgery, "Moderate risk surgery in moderate risk patient."  Meds include amlodipine, ASA, Coreg, Nexium, Lantus, lisinopril, loratadine, metformin, fish oil, Percocet, Crestor.  06/12/14 EKG: NSR, non-specific T wave abnormality.  06/12/14 CXR: IMPRESSION: No acute infiltrate or pulmonary edema. Mild infrahilar bronchitic changes.  09/25/14 left shoulder MRI: IMPRESSION: Rotator cuff tendinopathy with a 1.3 cm partial width, full-thickness tear of the anterior and far lateral supraspinatus.There is retraction of approximately 2.5 cm and no atrophy. Moderate to moderately severe acromioclavicular osteoarthritis. Subacromial/subdeltoid bursitis.  He is for labs at PAT. Last A1C at Dr. Chauncy Passyewey's office was 5.4 on 06/25/14.  I discussed available information with anesthesiologist Dr. Katrinka BlazingSmith. Since patient had been recently evaluated by Dr. Duanne Guessewey and cleared for surgery, he did not feel additional clearances would be needed unless there was a change in patient's symptomology.  Velna Ochsllison Amoni Scallan, PA-C [facility]Anesthesiology Phone [phone] 10/22/2014 6:30 PM

## 2014-10-22 NOTE — Pre-Procedure Instructions (Signed)
Jeremy SessionsMilton L Farmer  10/22/2014    Your procedure is scheduled on : Thursday October 31, 2014 at 12:25 PM.  [facility] 10:25 A.M.  Call this number if you have problems the morning of surgery: [phone]    Remember:  Do not eat food or drink liquids after midnight.  Take these medicines the morning of surgery with A SIP OF WATER : Amlodipine (Norvasc), Carvedilol (Coreg), Esomeprazole (Nexium), Loratadine (Claritin), Oxycodone (Percocet) if needed   Do NOT take any diabetic pills the morning of your surgery (Ex. Metformin/Glucophage)   After checking your blood sugar the morning of your surgery only take 10 units of Lantus    Stop taking any vitamins, herbal medications, Fish oil, Ibuprofen, Advil, Motrin, Aleve, etc on Thursday July 21st   Do not wear jewelry.  Do not wear lotions, powders, or cologne.   Men may shave face and neck.  Do not bring valuables to the hospital.  [facility] is not responsible for any belongings or valuables.  Contacts, dentures or bridgework may not be worn into surgery.  Leave your suitcase in the car.  After surgery it may be brought to your room.  For patients admitted to the hospital, discharge time will be determined by your treatment team.  Patients discharged the day of surgery will not be allowed to drive home.   Name and phone number of your driver:    Special instructions:  Shower using CHG soap the night before and the morning of your surgery  Please read over the following fact sheets that you were given. Pain Booklet, Coughing and Deep Breathing and Surgical Site Infection Prevention

## 2014-10-23 ENCOUNTER — Encounter (HOSPITAL_COMMUNITY): Payer: Self-pay

## 2014-10-23 ENCOUNTER — Encounter (HOSPITAL_COMMUNITY)
Admission: RE | Admit: 2014-10-23 | Discharge: 2014-10-23 | Disposition: A | Discharge status: Home / Self Care | Payer: Commercial Managed Care - HMO | Source: Ambulatory Visit | Attending: Orthopedic Surgery | Admitting: Orthopedic Surgery

## 2014-10-23 DIAGNOSIS — K219 Gastro-esophageal reflux disease without esophagitis: Secondary | ICD-10-CM | POA: Insufficient documentation

## 2014-10-23 DIAGNOSIS — Z79899 Other long term (current) drug therapy: Secondary | ICD-10-CM | POA: Insufficient documentation

## 2014-10-23 DIAGNOSIS — E780 Pure hypercholesterolemia: Secondary | ICD-10-CM | POA: Insufficient documentation

## 2014-10-23 DIAGNOSIS — Z01818 Encounter for other preprocedural examination: Secondary | ICD-10-CM | POA: Insufficient documentation

## 2014-10-23 DIAGNOSIS — Z794 Long term (current) use of insulin: Secondary | ICD-10-CM | POA: Diagnosis not present

## 2014-10-23 DIAGNOSIS — I10 Essential (primary) hypertension: Secondary | ICD-10-CM | POA: Diagnosis not present

## 2014-10-23 DIAGNOSIS — Z01812 Encounter for preprocedural laboratory examination: Secondary | ICD-10-CM | POA: Insufficient documentation

## 2014-10-23 DIAGNOSIS — Z7982 Long term (current) use of aspirin: Secondary | ICD-10-CM | POA: Diagnosis not present

## 2014-10-23 DIAGNOSIS — E119 Type 2 diabetes mellitus without complications: Secondary | ICD-10-CM | POA: Insufficient documentation

## 2014-10-23 LAB — CBC WITH DIFFERENTIAL/PLATELET
BASOS ABS: 0 10*3/uL (ref 0.0–0.1)
BASOS PCT: 1 % (ref 0–1)
Eosinophils Absolute: 0.2 10*3/uL (ref 0.0–0.7)
Eosinophils Relative: 4 % (ref 0–5)
HEMATOCRIT: 43.3 % (ref 39.0–52.0)
HEMOGLOBIN: 14.4 g/dL (ref 13.0–17.0)
Lymphocytes Relative: 40 % (ref 12–46)
Lymphs Abs: 2.2 10*3/uL (ref 0.7–4.0)
MCH: 30.9 pg (ref 26.0–34.0)
MCHC: 33.3 g/dL (ref 30.0–36.0)
MCV: 92.9 fL (ref 78.0–100.0)
Monocytes Absolute: 0.5 10*3/uL (ref 0.1–1.0)
Monocytes Relative: 8 % (ref 3–12)
NEUTROS ABS: 2.7 10*3/uL (ref 1.7–7.7)
NEUTROS PCT: 47 % (ref 43–77)
PLATELETS: 431 10*3/uL — AB (ref 150–400)
RBC: 4.66 MIL/uL (ref 4.22–5.81)
RDW: 13.6 % (ref 11.5–15.5)
WBC: 5.6 10*3/uL (ref 4.0–10.5)

## 2014-10-23 LAB — COMPREHENSIVE METABOLIC PANEL
ALK PHOS: 43 U/L (ref 38–126)
ALT: 15 U/L — ABNORMAL LOW (ref 17–63)
AST: 19 U/L (ref 15–41)
Albumin: 3.5 g/dL (ref 3.5–5.0)
Anion gap: 7 (ref 5–15)
BUN: 21 mg/dL — AB (ref 6–20)
CHLORIDE: 109 mmol/L (ref 101–111)
CO2: 26 mmol/L (ref 22–32)
Calcium: 8.9 mg/dL (ref 8.9–10.3)
Creatinine, Ser: 1.06 mg/dL (ref 0.61–1.24)
GFR calc Af Amer: >60 mL/min (ref >60)
GFR calc non Af Amer: >60 mL/min (ref >60)
GLUCOSE: 116 mg/dL — AB (ref 65–99)
Potassium: 4.6 mmol/L (ref 3.5–5.1)
Sodium: 142 mmol/L (ref 135–145)
TOTAL PROTEIN: 6.4 g/dL — AB (ref 6.5–8.1)
Total Bilirubin: 0.3 mg/dL (ref 0.3–1.2)

## 2014-10-23 LAB — GLUCOSE, CAPILLARY: GLUCOSE-CAPILLARY: 112 mg/dL — AB (ref 65–99)

## 2014-10-23 LAB — PROTIME-INR
INR: 1.07 (ref 0.00–1.49)
Prothrombin Time: 14.1 seconds (ref 11.6–15.2)

## 2014-10-23 LAB — APTT: aPTT: 32 seconds (ref 24–37)

## 2014-10-23 NOTE — Progress Notes (Signed)
PCP is Asencion Partridgeamille [name]. Patient denied having any acute cardiac or pulmonary issues. Patient informed Nurse that he had a stress test > 5 years ago, and patient also informed Nurse that he is scheduled to have a sleep study tomorrow.   Nurse inquired about blood glucose levels and patient informed Nurse that the highest his blood glucose has been was 129 and the lowest was 90. DOS Diabetes instructions reviewed with patient. Patient verbalized understanding.

## 2014-10-23 NOTE — Progress Notes (Signed)
   10/23/14 0857  OBSTRUCTIVE SLEEP APNEA  Have you ever been diagnosed with sleep apnea through a sleep study? No  Do you snore loudly (loud enough to be heard through closed doors)?  1  Do you often feel tired, fatigued, or sleepy during the daytime? 0  Has anyone observed you stop breathing during your sleep? 0  Do you have, or are you being treated for high blood pressure? 1  BMI more than 35 kg/m2? 1  Age over 63 years old? 1  Neck circumference greater than 40 cm/16 inches? 1  Gender: 1  Obstructive Sleep Apnea Score 6   This patient has screened at risk for sleep apnea using the STOP Bang tool used during a pre-surgical visit. A score of 4 or greater is at risk for sleep apnea.

## 2014-10-24 ENCOUNTER — Encounter: Payer: Self-pay | Admitting: Neurology

## 2014-10-24 ENCOUNTER — Ambulatory Visit (INDEPENDENT_AMBULATORY_CARE_PROVIDER_SITE_OTHER): Payer: Commercial Managed Care - HMO | Admitting: Neurology

## 2014-10-24 DIAGNOSIS — R0683 Snoring: Secondary | ICD-10-CM | POA: Diagnosis not present

## 2014-10-24 DIAGNOSIS — G4730 Sleep apnea, unspecified: Secondary | ICD-10-CM

## 2014-10-24 DIAGNOSIS — E1310 Other specified diabetes mellitus with ketoacidosis without coma: Secondary | ICD-10-CM | POA: Diagnosis not present

## 2014-10-24 DIAGNOSIS — G4710 Hypersomnia, unspecified: Secondary | ICD-10-CM

## 2014-10-24 DIAGNOSIS — E1110 Type 2 diabetes mellitus with ketoacidosis without coma: Secondary | ICD-10-CM

## 2014-10-24 LAB — HEMOGLOBIN A1C
Hgb A1c MFr Bld: 6 % — ABNORMAL HIGH (ref 4.8–5.6)
MEAN PLASMA GLUCOSE: 126 mg/dL
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
## 2014-10-24 NOTE — Patient Instructions (Signed)
Sleep Apnea  Sleep apnea is disorder that affects a person's sleep. A person with sleep apnea has abnormal pauses in their breathing when they sleep. It is hard for them to get a good sleep. This makes a person tired during the day. It also can lead to other physical problems. There are three types of sleep apnea. One type is when breathing stops for a short time because your airway is blocked (obstructive sleep apnea). Another type is when the brain sometimes fails to give the normal signal to breathe to the muscles that control your breathing (central sleep apnea). The third type is a combination of the other two types.  HOME CARE  · Do not sleep on your back. Try to sleep on your side.  · Take all medicine as told by your doctor.  · Avoid alcohol, calming medicines (sedatives), and depressant drugs.  · Try to lose weight if you are overweight. Talk to your doctor about a healthy weight goal.  Your doctor may have you use a device that helps to open your airway. It can help you get the air that you need. It is called a positive airway pressure (PAP) device. There are three types of PAP devices:  · Continuous positive airway pressure (CPAP) device.  · Nasal expiratory positive airway pressure (EPAP) device.  · Bilevel positive airway pressure (BPAP) device.  MAKE SURE YOU:  · Understand these instructions.  · Will watch your condition.  · Will get help right away if you are not doing well or get worse.  Document Released: 12/30/2007 Document Revised: 03/08/2012 Document Reviewed: 07/24/2011  [employer]® Patient Information ©2015 [employer]. This information is not intended to replace advice given to you by your health care provider. Make sure you discuss any questions you have with your health care provider.

## 2014-10-30 MED ORDER — LACTATED RINGERS IV SOLN: INTRAVENOUS | Status: DC

## 2014-10-30 MED ORDER — DEXTROSE 5 % IV SOLN
3.0000 g | INTRAVENOUS | Status: AC
  Administered 2014-10-31: 3 g via INTRAVENOUS
  Filled 2014-10-30 (×3): qty 3000

## 2014-10-31 ENCOUNTER — Ambulatory Visit (HOSPITAL_COMMUNITY): Payer: Commercial Managed Care - HMO | Admitting: Anesthesiology

## 2014-10-31 ENCOUNTER — Encounter (HOSPITAL_COMMUNITY): Payer: Self-pay | Admitting: *Deleted

## 2014-10-31 ENCOUNTER — Encounter (HOSPITAL_COMMUNITY)
Admission: RE | Disposition: A | Discharge status: Home / Self Care | Payer: Self-pay | Source: Ambulatory Visit | Attending: Orthopedic Surgery

## 2014-10-31 ENCOUNTER — Ambulatory Visit (HOSPITAL_COMMUNITY)
Admission: RE | Admit: 2014-10-31 | Discharge: 2014-10-31 | Disposition: A | Discharge status: Home / Self Care | Payer: Commercial Managed Care - HMO | Source: Ambulatory Visit | Attending: Orthopedic Surgery | Admitting: Orthopedic Surgery

## 2014-10-31 ENCOUNTER — Ambulatory Visit (HOSPITAL_COMMUNITY): Payer: Commercial Managed Care - HMO | Admitting: Vascular Surgery

## 2014-10-31 DIAGNOSIS — I10 Essential (primary) hypertension: Secondary | ICD-10-CM | POA: Insufficient documentation

## 2014-10-31 DIAGNOSIS — E782 Mixed hyperlipidemia: Secondary | ICD-10-CM | POA: Insufficient documentation

## 2014-10-31 DIAGNOSIS — E119 Type 2 diabetes mellitus without complications: Secondary | ICD-10-CM | POA: Insufficient documentation

## 2014-10-31 DIAGNOSIS — M25812 Other specified joint disorders, left shoulder: Secondary | ICD-10-CM | POA: Diagnosis present

## 2014-10-31 DIAGNOSIS — Z7982 Long term (current) use of aspirin: Secondary | ICD-10-CM | POA: Diagnosis not present

## 2014-10-31 DIAGNOSIS — Z794 Long term (current) use of insulin: Secondary | ICD-10-CM | POA: Insufficient documentation

## 2014-10-31 DIAGNOSIS — M7542 Impingement syndrome of left shoulder: Secondary | ICD-10-CM | POA: Diagnosis not present

## 2014-10-31 DIAGNOSIS — Z6839 Body mass index (BMI) 39.0-39.9, adult: Secondary | ICD-10-CM | POA: Insufficient documentation

## 2014-10-31 DIAGNOSIS — M75122 Complete rotator cuff tear or rupture of left shoulder, not specified as traumatic: Secondary | ICD-10-CM | POA: Diagnosis not present

## 2014-10-31 DIAGNOSIS — Z8673 Personal history of transient ischemic attack (TIA), and cerebral infarction without residual deficits: Secondary | ICD-10-CM | POA: Diagnosis not present

## 2014-10-31 DIAGNOSIS — M19012 Primary osteoarthritis, left shoulder: Secondary | ICD-10-CM | POA: Diagnosis not present

## 2014-10-31 DIAGNOSIS — M75102 Unspecified rotator cuff tear or rupture of left shoulder, not specified as traumatic: Secondary | ICD-10-CM | POA: Diagnosis not present

## 2014-10-31 HISTORY — PX: SHOULDER ARTHROSCOPY WITH SUBACROMIAL DECOMPRESSION, ROTATOR CUFF REPAIR AND BICEP TENDON REPAIR: SHX5687

## 2014-10-31 LAB — GLUCOSE, CAPILLARY
GLUCOSE-CAPILLARY: 97 mg/dL (ref 65–99)
GLUCOSE-CAPILLARY: 75 mg/dL (ref 65–99)

## 2014-10-31 SURGERY — SHOULDER ARTHROSCOPY WITH SUBACROMIAL DECOMPRESSION, ROTATOR CUFF REPAIR AND BICEP TENDON REPAIR
Anesthesia: General | Site: Shoulder | Laterality: Left

## 2014-10-31 MED ORDER — MIDAZOLAM HCL 2 MG/2ML IJ SOLN
INTRAMUSCULAR | Status: AC
  Filled 2014-10-31: qty 2

## 2014-10-31 MED ORDER — DEXTROSE 5 % IV SOLN
10.0000 mg | INTRAVENOUS | Status: DC | PRN
  Administered 2014-10-31: 20 ug/min via INTRAVENOUS

## 2014-10-31 MED ORDER — PROPOFOL 10 MG/ML IV BOLUS
INTRAVENOUS | Status: DC | PRN
  Administered 2014-10-31: 320 mg via INTRAVENOUS

## 2014-10-31 MED ORDER — HYDROMORPHONE HCL 1 MG/ML IJ SOLN: 0.5000 mg | INTRAMUSCULAR | Status: DC | PRN

## 2014-10-31 MED ORDER — ONDANSETRON HCL 4 MG/2ML IJ SOLN
INTRAMUSCULAR | Status: DC | PRN
  Administered 2014-10-31: 4 mg via INTRAVENOUS

## 2014-10-31 MED ORDER — FENTANYL CITRATE (PF) 250 MCG/5ML IJ SOLN
INTRAMUSCULAR | Status: AC
  Filled 2014-10-31: qty 5

## 2014-10-31 MED ORDER — EPHEDRINE SULFATE 50 MG/ML IJ SOLN
INTRAMUSCULAR | Status: DC | PRN
  Administered 2014-10-31: 20 mg via INTRAVENOUS

## 2014-10-31 MED ORDER — DIAZEPAM 5 MG PO TABS: 2.5000 mg | ORAL_TABLET | Freq: Four times a day (QID) | ORAL | Status: DC | PRN

## 2014-10-31 MED ORDER — LACTATED RINGERS IV SOLN
INTRAVENOUS | Status: DC | PRN
  Administered 2014-10-31: 12:00:00 via INTRAVENOUS

## 2014-10-31 MED ORDER — GLYCOPYRROLATE 0.2 MG/ML IJ SOLN
INTRAMUSCULAR | Status: DC | PRN
  Administered 2014-10-31: 0.2 mg via INTRAVENOUS

## 2014-10-31 MED ORDER — FENTANYL CITRATE (PF) 100 MCG/2ML IJ SOLN
INTRAMUSCULAR | Status: DC | PRN
  Administered 2014-10-31: 50 ug via INTRAVENOUS
  Administered 2014-10-31: 75 ug via INTRAVENOUS

## 2014-10-31 MED ORDER — LIDOCAINE HCL (CARDIAC) 20 MG/ML IV SOLN
INTRAVENOUS | Status: DC | PRN
  Administered 2014-10-31: 100 mg via INTRAVENOUS

## 2014-10-31 MED ORDER — ARTIFICIAL TEARS OP OINT
TOPICAL_OINTMENT | OPHTHALMIC | Status: DC | PRN
  Administered 2014-10-31: 1 via OPHTHALMIC

## 2014-10-31 MED ORDER — PROPOFOL 10 MG/ML IV BOLUS
INTRAVENOUS | Status: AC
  Filled 2014-10-31: qty 20

## 2014-10-31 MED ORDER — ONDANSETRON HCL 4 MG/2ML IJ SOLN: 4.0000 mg | Freq: Once | INTRAMUSCULAR | Status: DC | PRN

## 2014-10-31 MED ORDER — PHENYLEPHRINE HCL 10 MG/ML IJ SOLN
INTRAMUSCULAR | Status: DC | PRN
  Administered 2014-10-31: 120 ug via INTRAVENOUS

## 2014-10-31 MED ORDER — SODIUM CHLORIDE 0.9 % IR SOLN
Status: DC | PRN
  Administered 2014-10-31: 1000 mL
  Administered 2014-10-31: 6000 mL
  Administered 2014-10-31: 3000 mL
  Administered 2014-10-31: 6000 mL

## 2014-10-31 MED ORDER — NAPROXEN 500 MG PO TABS: 500.0000 mg | ORAL_TABLET | Freq: Two times a day (BID) | ORAL | Status: DC

## 2014-10-31 MED ORDER — FENTANYL CITRATE (PF) 100 MCG/2ML IJ SOLN
INTRAMUSCULAR | Status: AC
  Filled 2014-10-31: qty 2

## 2014-10-31 MED ORDER — CHLORHEXIDINE GLUCONATE 4 % EX LIQD
60.0000 mL | Freq: Once | CUTANEOUS | Status: DC
Start: 2014-10-31 — End: 2014-10-31

## 2014-10-31 MED ORDER — OXYCODONE-ACETAMINOPHEN 5-325 MG PO TABS: 1.0000 | ORAL_TABLET | ORAL | Status: DC | PRN

## 2014-10-31 MED ORDER — SUCCINYLCHOLINE CHLORIDE 20 MG/ML IJ SOLN
INTRAMUSCULAR | Status: DC | PRN
  Administered 2014-10-31: 130 mg via INTRAVENOUS

## 2014-10-31 MED ORDER — LIDOCAINE HCL 4 % MT SOLN
OROMUCOSAL | Status: DC | PRN
  Administered 2014-10-31: 4 mL via TOPICAL

## 2014-10-31 MED ORDER — FENTANYL CITRATE (PF) 100 MCG/2ML IJ SOLN
100.0000 ug | Freq: Once | INTRAMUSCULAR | Status: AC
  Administered 2014-10-31: 100 ug via INTRAVENOUS

## 2014-10-31 SURGICAL SUPPLY — 62 items
ANCH SUT SWLK 19.1X4.75 VT (Anchor) ×4 IMPLANT
ANCHOR PEEK 4.75X19.1 SWLK C (Anchor) ×4 IMPLANT
BLADE CUTTER GATOR 3.5 (BLADE) ×2 IMPLANT
BLADE GREAT WHITE 4.2 (BLADE) ×2 IMPLANT
BLADE SURG 11 STRL SS (BLADE) ×2 IMPLANT
BOOTCOVER CLEANROOM LRG (PROTECTIVE WEAR) ×4 IMPLANT
BUR OVAL 4.0 (BURR) ×2 IMPLANT
CANISTER SUCT LVC 12 LTR MEDI- (MISCELLANEOUS) ×2 IMPLANT
CANNULA ACUFLEX KIT 5X76 (CANNULA) ×1 IMPLANT
CANNULA DRILOCK 5.0X75 (CANNULA) ×2 IMPLANT
CANNULA TWIST IN 8.25X7CM (CANNULA) ×1 IMPLANT
CONNECTOR 5 IN 1 STRAIGHT STRL (MISCELLANEOUS) ×2 IMPLANT
DRAPE INCISE 23X17 IOBAN STRL (DRAPES)
DRAPE INCISE 23X17 STRL (DRAPES) IMPLANT
DRAPE INCISE IOBAN 23X17 STRL (DRAPES) IMPLANT
DRAPE STERI 35X30 U-POUCH (DRAPES) IMPLANT
DRAPE SURG 17X11 SM STRL (DRAPES) ×2 IMPLANT
DRAPE U-SHAPE 47X51 STRL (DRAPES) IMPLANT
DRSG PAD ABDOMINAL 8X10 ST (GAUZE/BANDAGES/DRESSINGS) ×4 IMPLANT
DURAPREP 26ML APPLICATOR (WOUND CARE) ×4 IMPLANT
ELECT REM PT RETURN 9FT ADLT (ELECTROSURGICAL) ×2
ELECTRODE REM PT RTRN 9FT ADLT (ELECTROSURGICAL) ×1 IMPLANT
GAUZE SPONGE 4X4 12PLY STRL (GAUZE/BANDAGES/DRESSINGS) ×2 IMPLANT
GLOVE BIO SURGEON STRL SZ7.5 (GLOVE) ×2 IMPLANT
GLOVE BIO SURGEON STRL SZ8 (GLOVE) ×2 IMPLANT
GLOVE EUDERMIC 7 POWDERFREE (GLOVE) ×2 IMPLANT
GLOVE SS BIOGEL STRL SZ 7.5 (GLOVE) ×1 IMPLANT
GLOVE SUPERSENSE BIOGEL SZ 7.5 (GLOVE) ×1
GOWN STRL REUS W/ TWL LRG LVL3 (GOWN DISPOSABLE) ×1 IMPLANT
GOWN STRL REUS W/ TWL XL LVL3 (GOWN DISPOSABLE) ×4 IMPLANT
GOWN STRL REUS W/TWL LRG LVL3 (GOWN DISPOSABLE) ×2
GOWN STRL REUS W/TWL XL LVL3 (GOWN DISPOSABLE) ×8
KIT BASIN OR (CUSTOM PROCEDURE TRAY) ×2 IMPLANT
KIT ROOM TURNOVER OR (KITS) ×2 IMPLANT
KIT SHOULDER TRACTION (DRAPES) ×2 IMPLANT
MANIFOLD NEPTUNE II (INSTRUMENTS) ×2 IMPLANT
NDL SPNL 18GX3.5 QUINCKE PK (NEEDLE) ×1 IMPLANT
NDL SUT 6 .5 CRC .975X.05 MAYO (NEEDLE) IMPLANT
NEEDLE MAYO TAPER (NEEDLE)
NEEDLE SPNL 18GX3.5 QUINCKE PK (NEEDLE) ×2 IMPLANT
NS IRRIG 1000ML POUR BTL (IV SOLUTION) ×2 IMPLANT
PACK SHOULDER (CUSTOM PROCEDURE TRAY) ×2 IMPLANT
PAD ARMBOARD 7.5X6 YLW CONV (MISCELLANEOUS) ×4 IMPLANT
SET ARTHROSCOPY TUBING (MISCELLANEOUS) ×2
SET ARTHROSCOPY TUBING LN (MISCELLANEOUS) ×1 IMPLANT
SLING ARM FOAM STRAP XLG (SOFTGOODS) ×1 IMPLANT
SLING ARM LRG ADULT FOAM STRAP (SOFTGOODS) IMPLANT
SLING ARM MED ADULT FOAM STRAP (SOFTGOODS) ×2 IMPLANT
SPONGE LAP 4X18 X RAY DECT (DISPOSABLE) IMPLANT
STRIP CLOSURE SKIN 1/2X4 (GAUZE/BANDAGES/DRESSINGS) ×2 IMPLANT
SUT MNCRL AB 3-0 PS2 18 (SUTURE) ×2 IMPLANT
SUT MNCRL AB 4-0 PS2 18 (SUTURE) ×1 IMPLANT
SUT PDS AB 0 CT 36 (SUTURE) ×1 IMPLANT
SUT RETRIEVER GRASP 30 DEG (SUTURE) IMPLANT
SUT TIGER TAPE 7 IN WHITE (SUTURE) ×1 IMPLANT
SYR 20CC LL (SYRINGE) IMPLANT
TAPE FIBER 2MM 7IN #2 BLUE (SUTURE) ×1 IMPLANT
TAPE PAPER 3X10 WHT MICROPORE (GAUZE/BANDAGES/DRESSINGS) ×2 IMPLANT
TOWEL OR 17X24 6PK STRL BLUE (TOWEL DISPOSABLE) ×2 IMPLANT
TOWEL OR 17X26 10 PK STRL BLUE (TOWEL DISPOSABLE) ×2 IMPLANT
WAND SUCTION MAX 4MM 90S (SURGICAL WAND) ×2 IMPLANT
WATER STERILE IRR 1000ML POUR (IV SOLUTION) ×2 IMPLANT

## 2014-10-31 NOTE — Discharge Instructions (Signed)
° °  Kevin M. Supple, M.D., F.A.A.O.S. °Orthopaedic Surgery °Specializing in Arthroscopic and Reconstructive °Surgery of the Shoulder and Knee °[phone] °[address] - [place], [place] [address] - Fax [phone] ° °POST-OP SHOULDER ARTHROSCOPIC ROTATOR CUFF  REPAIR INSTRUCTIONS ° °1. Call the office at [phone] to schedule your first post-op appointment 7-10 days from the date of your surgery. ° °2. Leave the steri-strips in place over your incisions when performing dressing changes and showering. You may remove your dressings and begin showering 72 hours from surgery. You can expect drainage that is clear to bloody in nature that occasionally will soak through your dressings. If this occurs go ahead and perform a dressing change. The drainage should lessen daily and when there is no drainage from your incisions feel free to go without a dressing. ° °3. Wear your sling/immobilizer at all times except to perform the exercises below or to occasionally let your arm dangle by your side to stretch your elbow. You also need to sleep in your sling immobilizer until instructed otherwise. ° °4. Range of motion to your elbow, wrist, and hand are encouraged 3-5 times daily. Exercise to your hand and fingers helps to reduce swelling you may experience. ° °5. Utilize ice to the shoulder 3-4 times minimum a day and additionally if you are experiencing pain. ° °6. You may one-armed drive when safely off of narcotics and muscle relaxants. You may use your hand that is in the sling to support the steering wheel only. However, should it be your right arm that is in the sling it is not to be used for gear shifting in a manual transmission. ° °7. If you had a block pre-operatively to provide post-op pain relief you may want to go ahead and begin utilizing your pain meds as your arm begins to wake up. Blocks can sometimes last up to 16-18 hours. If you are still pain-free prior to going to bed you may want to  strongly consider taking a pain medication to avoid being awakened in the night with the onset of pain. A muscle relaxant is also provided for you should you experience muscle spasms. It is recommended that if you are experiencing pain that your pain medication alone is not controlling, add the muscle relaxant along with the pain medication which can give additional pain relief. The first one to two days is generally the most severe of your pain and then should gradually decrease. As your pain lessens it is recommended that you decrease your use of the pain medications to an "as needed basis" only and to always comply with the recommended dosages of the pain medications. ° °8. Pain medications can produce constipation along with their use. If you experience this, the use of an over the counter stool softener or laxative daily is recommended.  ° °9. For additional questions or concerns, please do not hesitate to call the office. If after hours there is an answering service to forward your concerns to the physician on call. ° °POST-OP EXERCISES ° °Pendulum Exercises ° °Perform pendulum exercises while standing and bending at the waist. Support your uninvolved arm on a table or chair and allow your operated arm to hang freely. Make sure to do these exercises passively - not using you shoulder muscle. ° °Repeat 20 times. Do 3 sessions per day. ° ° °

## 2014-10-31 NOTE — H&P (Signed)
Roetta Sessions    Chief Complaint: LEFT SHOULDER IMPINGEMENT  HPI: The patient is a 64 y.o. male with chronic left shoulder pain and MRI evidence of full thickness rotator cuff tear  Past Medical History  Diagnosis Date  . Hypertension   . High cholesterol   . GERD (gastroesophageal reflux disease)   . Stroke 2009    "mini stroke; denies residual on 06/15/2013)  . Morbid obesity due to excess calories   . Mixed hyperlipidemia   . Osteoarthritis   . Impotence of organic origin   . Allergic rhinitis   . Fatigue   . Snoring   . Left shoulder pain   . Type II diabetes mellitus     Past Surgical History  Procedure Laterality Date  . Shoulder open rotator cuff repair Right 2006    History reviewed. No pertinent family history.  Social History:  reports that he has never smoked. He has never used smokeless tobacco. He reports that he does not drink alcohol or use illicit drugs.  Allergies:  Allergies  Allergen Reactions  . Peanut-Containing Drug Products Anaphylaxis, Swelling and Other (See Comments)    And peanut oil    Medications Prior to Admission  Medication Sig Dispense Refill  . amLODipine (NORVASC) 5 MG tablet Take 2.5 mg by mouth daily.     Marland Kitchen aspirin EC 81 MG tablet Take 81 mg by mouth daily.     . carvedilol (COREG) 12.5 MG tablet Take 25 mg by mouth daily.     . Insulin Glargine (LANTUS SOLOSTAR) 100 UNIT/ML Solostar Pen Inject 20 Units into the skin every morning.     . loratadine (CLARITIN) 10 MG tablet Take 10 mg by mouth daily.    . metFORMIN (GLUCOPHAGE) 500 MG tablet Take 250 mg by mouth 2 (two) times daily with a meal.    . Omega-3 Fatty Acids (FISH OIL PO) Take 1 capsule by mouth daily.    Marland Kitchen oxyCODONE-acetaminophen (PERCOCET) 5-325 MG per tablet Take 1-2 tablets by mouth every 6 (six) hours as needed for severe pain. (Patient taking differently: Take 1 tablet by mouth every 6 (six) hours as needed for severe pain. ) 20 tablet 0  . rosuvastatin (CRESTOR) 10  MG tablet Take 10 mg by mouth daily.    . chlorpheniramine-HYDROcodone (TUSSIONEX PENNKINETIC ER) 10-8 MG/5ML LQCR Take 5 mLs by mouth every 12 (twelve) hours as needed for cough. 115 mL 0     Physical Exam: left shoulder pain and weakness as noted at recent office visits  Vitals  Pulse Rate:  [54-55] 54 (07/28 1130) Resp:  [20-24] 24 (07/28 1130) BP: (147)/(66) 147/66 mmHg (07/28 1037) SpO2:  [100 %] 100 % (07/28 1130) Weight:  [122.471 kg (270 lb)] 122.471 kg (270 lb) (07/28 1037)  Assessment/Plan  Impression: LEFT SHOULDER IMPINGEMENT with ac joint oa and full thickness rotator cuff tear  Plan of Action: Procedure(s): LEFT SHOULDER ARTHROSCOPY WITH SUBACROMIAL DECOMPRESSION, DISTAL CLAVICLE RESECTION, ROTATOR CUFF REPAIR   Meighan Treto M 10/31/2014, 11:33 AM Contact # [phone]

## 2014-10-31 NOTE — Anesthesia Preprocedure Evaluation (Addendum)
Anesthesia Evaluation  Patient identified by MRN, date of birth, ID band Patient awake    Reviewed: Allergy & Precautions, NPO status , Patient's Chart, lab work & pertinent test results  Airway Mallampati: II       Dental  (+) Missing   Pulmonary sleep apnea ,    Pulmonary exam normal       Cardiovascular hypertension, Pt. on medications Normal cardiovascular exam    Neuro/Psych CVA    GI/Hepatic GERD-  ,  Endo/Other  diabetes, Type 2, Oral Hypoglycemic AgentsMorbid obesity  Renal/GU      Musculoskeletal  (+) Arthritis -,   Abdominal   Peds  Hematology   Anesthesia Other Findings   Reproductive/Obstetrics                            Anesthesia Physical Anesthesia Plan  ASA: III  Anesthesia Plan: General   Post-op Pain Management: GA combined w/ Regional for post-op pain   Induction: Intravenous  Airway Management Planned: Oral ETT  Additional Equipment:   Intra-op Plan:   Post-operative Plan: Extubation in OR  Informed Consent: I have reviewed the patients History and Physical, chart, labs and discussed the procedure including the risks, benefits and alternatives for the proposed anesthesia with the patient or authorized representative who has indicated his/her understanding and acceptance.     Plan Discussed with: CRNA, Surgeon and Anesthesiologist  Anesthesia Plan Comments:         Anesthesia Quick Evaluation

## 2014-10-31 NOTE — Op Note (Signed)
10/31/2014  2:01 PM  PATIENT:   Jeremy Farmer  64 y.o. male  PRE-OPERATIVE DIAGNOSIS:  LEFT SHOULDER IMPINGEMENT, AC joint OA, RCT  POST-OPERATIVE DIAGNOSIS:  Same with labral tear  PROCEDURE:  LSA, labral debridement, SAD, DCR, RCR  SURGEON:  Hanni Milford, Vania Rea M.D.  ASSISTANTS: Shuford pac   ANESTHESIA:   GET + ISB  EBL: min  SPECIMEN:  none  Drains: none   PATIENT DISPOSITION:  PACU - hemodynamically stable.    PLAN OF CARE: Discharge to home after PACU  Dictation# [id number]   Contact # [phone]

## 2014-10-31 NOTE — Transfer of Care (Signed)
Immediate Anesthesia Transfer of Care Note  Patient: Jeremy Farmer  Procedure(s) Performed: Procedure(s): LEFT SHOULDER ARTHROSCOPY WITH SUBACROMIAL DECOMPRESSION, DISTAL CLAVICLE RESECTION, ROTATOR CUFF REPAIR  (Left)  Patient Location: PACU  Anesthesia Type:General  Level of Consciousness: awake, alert  and oriented  Airway & Oxygen Therapy: Patient Spontanous Breathing and Patient connected to nasal cannula oxygen  Post-op Assessment: Report given to RN and Post -op Vital signs reviewed and stable  Post vital signs: Reviewed and stable  Last Vitals:  Filed Vitals:   10/31/14 1148  BP: 146/64  Pulse: 50  Resp: 18    Complications: No apparent anesthesia complications

## 2014-10-31 NOTE — Anesthesia Procedure Notes (Signed)
Procedure Name: Intubation Date/Time: 10/31/2014 12:15 PM Performed by: Susa Loffler Pre-anesthesia Checklist: Patient identified, Emergency Drugs available, Suction available, Patient being monitored and Timeout performed Patient Re-evaluated:Patient Re-evaluated prior to inductionOxygen Delivery Method: Circle system utilized Preoxygenation: Pre-oxygenation with 100% oxygen Intubation Type: IV induction Ventilation: Mask ventilation without difficulty Laryngoscope Size: Mac and 4 Grade View: Grade I Tube type: Oral Tube size: 7.5 mm Number of attempts: 1 Airway Equipment and Method: Stylet and LTA kit utilized Placement Confirmation: ETT inserted through vocal cords under direct vision,  positive ETCO2 and breath sounds checked- equal and bilateral Secured at: 22 cm Tube secured with: Tape Dental Injury: Teeth and Oropharynx as per pre-operative assessment

## 2014-11-01 ENCOUNTER — Encounter (HOSPITAL_COMMUNITY): Payer: Self-pay | Admitting: Orthopedic Surgery

## 2014-11-01 NOTE — Op Note (Signed)
NAMEHARRIE, Jeremy Farmer                ACCOUNT NO.:  [id number]  MEDICAL RECORD NO.:  [id number]  LOCATION:  [facility]                         FACILITY:  [facility]  PHYSICIAN:  Vania Rea. Liba Hulsey, M.D.  DATE OF BIRTH:  Aug 06, 1950  DATE OF PROCEDURE:  10/31/2014 DATE OF DISCHARGE:  10/31/2014                              OPERATIVE REPORT   PREOPERATIVE DIAGNOSIS: 1. Chronic left shoulder impingement syndrome. 2. Left shoulder full-thickness rotator cuff tear by MRI criteria. 3. Left shoulder symptomatic AC joint arthropathy.  POSTOPERATIVE DIAGNOSIS: 1. Chronic left shoulder impingement syndrome. 2. Left shoulder full-thickness rotator cuff tear. 3. Left shoulder AC joint arthropathy. 4. Left shoulder degenerative labral tear.  PROCEDURE: 1. Shoulder examination under anesthesia. 2. Left shoulder glenoid joint diagnostic arthroscopy. 3. Labral debridement. 4. Arthroscopic subacromial decompression bursectomy. 5. Arthroscopic distal clavicle resection. 6. Arthroscopic rotator cuff repair using a double-row suture bridge     repair construct.  SURGEON:  Vania Rea. Carynn Felling, M.D.  Threasa HeadsFrench Ana A. Shuford, P.A.-C.  ANESTHESIA:  General endotracheal as well as an interscalene block.  ESTIMATED BLOOD LOSS:  Minimal.  DRAINS:  None.  HISTORY:  Mr. Haapala is a 64 year old gentleman who has had chronic and progressive increasing left shoulder pain with clinical examination showing a severely positive impingement sign with pain and weakness on testing of the rotator cuff.  His preoperative MRI scan confirms evidence for a full-thickness rotator cuff tear.  Due to his ongoing pain, functional limitations, and failure to respond to conservative management, he was brought to the operating room at this time for planned left shoulder arthroscopy with planned rotator cuff tear.  Preoperatively, I counseled Mr. Cryder regarding treatment options and the associated potential risks versus benefits  thereof.  Possible complications were reviewed including bleeding, infection, neurovascular injury, persistent pain, loss of motion, anesthetic complication, recurrence of rotator cuff tear, and possible need for additional surgery.  He understands and accepts and agrees with our planned procedure.  PROCEDURE IN DETAIL:  After undergoing routine preop evaluation, the patient received prophylactic antibiotics.  An interscalene block was established in the holding area by the Anesthesia Department.  Brought into the operating room, placed supine on the operating table, underwent smooth induction of a general endotracheal anesthesia.  Turned to the right lateral decubitus position on a beanbag and appropriately padded and protected.  Left shoulder examination under anesthesia revealed full motion and no instability patterns were noted.  Left arm was then suspended at 70 degrees of abduction with 10 pounds of traction.  Left shoulder region was sterilely prepped and draped in standard fashion. Time-out was called.  Posterior portal was established into the glenohumeral joint and anterior portal was established under direct visualization.  The glenohumeral articular surfaces were all found to be in good condition.  No instability patterns noted.  There was degenerative tearing of the anterior and superior labrum which we debrided back to stable margin with the shaver.  The biceps anchor was stable.  Biceps tendon was of normal caliber with no distal instability. Rotator cuff showed obvious full-thickness defect involving the distal supra and portions of the anterior infraspinatus.  We completed inspection of the glenohumeral joint, fluids  and instruments were removed.  The arm was then dropped down to 30 degrees of abduction. Arthroscope was introduced into the subacromial space to the posterior portal and a direct lateral portal established in the subacromial space. Abundant dense bursal  tissue and multiple adhesions were encountered, and these were all divided and excised with a combination of shaver and a Stryker wand.  The wand was then used to remove the periosteum from the undersurface of the anterior half of the acromion, and a subacromial depression was performed with a bur creating a type 1 morphology. Portal was then established directly anterior to the distal clavicle, and distal clavicle resection was performed with a bur.  Care was taken to confirm visualization of entire circumference of the distal clavicle to ensure adequate removal of bone.  We then completed the subacromial/subdeltoid bursectomy.  The rotator cuff tear was debrided back to healthy tissue.  We prepared the greater tuberosity debriding the bone to a bleeding bed.  We confirmed that the cuff tissue could be appropriately mobilized.  At this point, through a stab wound off the lateral margin of acromion, placed 2 Arthrex SwiveLock suture anchors loaded with fiber tapes.  The limbs of the fiber tape as well as the FiberWire were then all then shuttled up through the torn margin of the rotator cuff equidistant across the width of the tear.  We then placed 2 lateral row SwiveLock suture anchors and all these were 4.75.  We went ahead and tied the opposing limbs of the 2 fiber wires and this allowed [place] to create a medial anchoring stitch and the lateral limbs were then brought in to the eyelet of the SwiveLocks and created a double-row suture bridge repair construct with an addition of the medial stitch, and this overall construct was much to our satisfaction.  Suture limbs were all then appropriately clipped.  Final hemostasis was obtained. Fluid and instruments were removed.  The portals were closed with Monocryl and Steri-Strips with dry dressings taped at the left shoulder. Left arm was placed in a sling.  The patient was awakened, extubated, and taken to the recovery room in stable  condition.  Ralene Bathe, P.A.-C., was used as an Geophysicist/field seismologist throughout this case, essential for help with positioning the patient, positioning the extremity, management of the arthroscopic equipment, tissue manipulation, suture management, wound closure, and intraoperative decision making.     Vania Rea. Melysa Schroyer, M.D.     KMS/MEDQ  D:  10/31/2014  T:  11/01/2014  Job:  [id number]

## 2014-11-05 ENCOUNTER — Emergency Department (HOSPITAL_COMMUNITY)
Admission: EM | Admit: 2014-11-05 | Discharge: 2014-11-05 | Disposition: A | Discharge status: Home / Self Care | Payer: Commercial Managed Care - HMO | Attending: Emergency Medicine | Admitting: Emergency Medicine

## 2014-11-05 ENCOUNTER — Encounter (HOSPITAL_COMMUNITY): Payer: Self-pay | Admitting: Emergency Medicine

## 2014-11-05 ENCOUNTER — Emergency Department (HOSPITAL_COMMUNITY): Payer: Commercial Managed Care - HMO

## 2014-11-05 DIAGNOSIS — Z8719 Personal history of other diseases of the digestive system: Secondary | ICD-10-CM | POA: Diagnosis not present

## 2014-11-05 DIAGNOSIS — Z87438 Personal history of other diseases of male genital organs: Secondary | ICD-10-CM | POA: Insufficient documentation

## 2014-11-05 DIAGNOSIS — I10 Essential (primary) hypertension: Secondary | ICD-10-CM | POA: Insufficient documentation

## 2014-11-05 DIAGNOSIS — Z8739 Personal history of other diseases of the musculoskeletal system and connective tissue: Secondary | ICD-10-CM | POA: Diagnosis not present

## 2014-11-05 DIAGNOSIS — R21 Rash and other nonspecific skin eruption: Secondary | ICD-10-CM | POA: Diagnosis present

## 2014-11-05 DIAGNOSIS — B029 Zoster without complications: Secondary | ICD-10-CM | POA: Insufficient documentation

## 2014-11-05 DIAGNOSIS — E119 Type 2 diabetes mellitus without complications: Secondary | ICD-10-CM | POA: Insufficient documentation

## 2014-11-05 DIAGNOSIS — Z8673 Personal history of transient ischemic attack (TIA), and cerebral infarction without residual deficits: Secondary | ICD-10-CM | POA: Diagnosis not present

## 2014-11-05 LAB — CBC
HEMATOCRIT: 37.8 % — AB (ref 39.0–52.0)
Hemoglobin: 13 g/dL (ref 13.0–17.0)
MCH: 31.3 pg (ref 26.0–34.0)
MCHC: 34.4 g/dL (ref 30.0–36.0)
MCV: 91.1 fL (ref 78.0–100.0)
Platelets: 355 10*3/uL (ref 150–400)
RBC: 4.15 MIL/uL — ABNORMAL LOW (ref 4.22–5.81)
RDW: 13 % (ref 11.5–15.5)
WBC: 6 10*3/uL (ref 4.0–10.5)

## 2014-11-05 LAB — BASIC METABOLIC PANEL
ANION GAP: 6 (ref 5–15)
BUN: 14 mg/dL (ref 6–20)
CHLORIDE: 104 mmol/L (ref 101–111)
CO2: 27 mmol/L (ref 22–32)
Calcium: 8.6 mg/dL — ABNORMAL LOW (ref 8.9–10.3)
Creatinine, Ser: 0.92 mg/dL (ref 0.61–1.24)
GFR calc Af Amer: >60 mL/min (ref >60)
Glucose, Bld: 78 mg/dL (ref 65–99)
POTASSIUM: 3.8 mmol/L (ref 3.5–5.1)
SODIUM: 137 mmol/L (ref 135–145)

## 2014-11-05 LAB — I-STAT TROPONIN, ED: TROPONIN I, POC: 0 ng/mL (ref 0.00–0.08)

## 2014-11-05 MED ORDER — SODIUM CHLORIDE 0.9 % IV BOLUS (SEPSIS)
1000.0000 mL | Freq: Once | INTRAVENOUS | Status: AC
  Administered 2014-11-05: 1000 mL via INTRAVENOUS

## 2014-11-05 MED ORDER — VALACYCLOVIR HCL 1 G PO TABS: 1000.0000 mg | ORAL_TABLET | Freq: Three times a day (TID) | ORAL | Status: DC

## 2014-11-05 MED ORDER — MORPHINE SULFATE 4 MG/ML IJ SOLN
4.0000 mg | Freq: Once | INTRAMUSCULAR | Status: AC
  Administered 2014-11-05: 4 mg via INTRAVENOUS
  Filled 2014-11-05: qty 1

## 2014-11-05 MED ORDER — ACYCLOVIR 400 MG PO TABS: 400.0000 mg | ORAL_TABLET | Freq: Four times a day (QID) | ORAL | Status: DC

## 2014-11-05 MED ORDER — OXYCODONE-ACETAMINOPHEN 5-325 MG PO TABS: 2.0000 | ORAL_TABLET | ORAL | Status: DC | PRN

## 2014-11-05 MED ORDER — ACYCLOVIR 800 MG PO TABS: 800.0000 mg | ORAL_TABLET | Freq: Every day | ORAL | Status: DC

## 2014-11-05 NOTE — ED Notes (Signed)
Removed pt's shirt to see a red blistery rash running in a line from his left back to mid abdomen.  Pt denies a hx of shingles.  After continuing conversation pt reports his PCP said she thought it was shingles and that he could get medications for it from the ED.

## 2014-11-05 NOTE — Discharge Instructions (Signed)

## 2014-11-05 NOTE — ED Provider Notes (Signed)
History   Chief Complaint  Patient presents with  . Shortness of Breath  . Chest Pain  . Rash    HPI  SHAQUELLE HERNON is a 64 y.o. male with PMH as below notable for HTN, DM, TIA, obesity, recent elective RTC surgery 10/31/14 who presents to ED with c/o CP, SOB, fatigue, dizziness since surgery.  Any exertion make very SOB. Pt also having subj fevers and prod cough with green sputum.   Pt describes this as CP as a dull, heat, ache in L chest; no radiation. + pleuritic. No pressure/heaviness.    Seen at PCP today and sent for eval of abd rash c/f shingles. Rash is very painful/burning/itchy and located on R mid chest and wraps around flank and started day after surgery.   Onset of symptoms: gradual. Modifying factors exertion worsens.  Severity: moderate.  Associated symptoms: as above.  Hx of similar symptoms: no.    Denies leg swelling or pain.  Past medical/surgical history, social history, medications, allergies and FH have been reviewed with patient and/or in documentation. Furthermore, if pt family or friend(s) present, additional historical information was obtained from them.  Past Medical History  Diagnosis Date  . Hypertension   . High cholesterol   . GERD (gastroesophageal reflux disease)   . Stroke 2009    "mini stroke; denies residual on 06/15/2013)  . Morbid obesity due to excess calories   . Mixed hyperlipidemia   . Osteoarthritis   . Impotence of organic origin   . Allergic rhinitis   . Fatigue   . Snoring   . Left shoulder pain   . Type II diabetes mellitus    Past Surgical History  Procedure Laterality Date  . Shoulder open rotator cuff repair Right 2006  . Shoulder arthroscopy with subacromial decompression, rotator cuff repair and bicep tendon repair Left 10/31/2014    Procedure: LEFT SHOULDER ARTHROSCOPY WITH SUBACROMIAL DECOMPRESSION, DISTAL CLAVICLE RESECTION, ROTATOR CUFF REPAIR ;  Surgeon: Francena Hanly, MD;  Location: [facility] OR;  Service: Orthopedics;   Laterality: Left;   History reviewed. No pertinent family history. History  Substance Use Topics  . Smoking status: Never Smoker   . Smokeless tobacco: Never Used  . Alcohol Use: No     Review of Systems Constitutional: + F/C, +fatigue.  HENT: - congestion, -rhinorrhea, -sore throat.   Eyes: - eye pain, -visual disturbance.  Respiratory: + cough, +SOB, -hemoptysis.   Cardiovascular: + CP, -palps.  Gastrointestinal: - N/V/D, -abd pain  Genitourinary: - flank pain, -dysuria, -frequency.  Musculoskeletal: - myalgia/arthritis, -joint swelling, -gait abnormality, -back pain, -neck pain/stiffness, -leg pain/swelling.  Skin: + rash/lesion.  Neurological: - focal weakness, +lightheadedness, -numbness, -HA.  All other systems reviewed and are negative.   Physical Exam  Physical Exam  ED Triage Vitals  Enc Vitals Group     BP 11/05/14 1428 142/67 mmHg     Pulse Rate 11/05/14 1428 50     Resp 11/05/14 1428 17     Temp 11/05/14 1428 98.2 F (36.8 C)     Temp Source 11/05/14 1428 Oral     SpO2 11/05/14 1424 96 %     Weight 11/05/14 1428 272 lb (123.378 kg)     Height 11/05/14 1428 5' 9.5" (1.765 m)     Head Cir --      Peak Flow --      Pain Score 11/05/14 1427 6     Pain Loc --      Pain Edu? --  Excl. in GC? --    Constitutional: Patient is well appearing, hydrated male and in no acute distress Head: Normocephalic and atraumatic.  Eyes: Extraocular motion intact, no scleral icterus Mouth: MMM, OP clear Neck: Supple without meningismus, mass, or overt JVD Respiratory: No respiratory distress. Normal WOB. No w/r/g. CV: RRR, no obvious murmurs.  Pulses +2 and symmetric. Euvolemic Abdomen: Soft, NT, ND, no r/g. No mass.  MSK: Extremities are atraumatic without deformity, ROM intact Skin: Vesicular, erythematous rash to T6 dermatome on R. Neuro: AAOx4, MAE 5/5 sym, no focal deficit noted   ED Course  Procedures   Labs Reviewed  BASIC METABOLIC PANEL - Abnormal;  Notable for the following:    Calcium 8.6 (*)    All other components within normal limits  CBC - Abnormal; Notable for the following:    RBC 4.15 (*)    HCT 37.8 (*)    All other components within normal limits  I-STAT TROPOININ, ED   I personally reviewed and interpreted all labs.  No orders to display   I personally viewed above image(s) which were used in my medical decision making. Formal interpretations by Radiology.  MDM: DONAVAN KERLIN is a 64 y.o. male with H&P as above who p/w CC: CP/SOB/rash, cough  EKG sinus brady, normal intervals and axis, NSTWA, prior TWI in I&aVL no longer present when compared to 06/13/14.  -Initial impression: pt has shingles in T6 dermatome on R.  Working up for PNA and will also get screening labs, IVF, pain meds.  Old records reviewed (if available). Labs and imaging reviewed personally by myself and considered in medical decision making if ordered. Clinical Impression: 1. Shingles     Disposition: Discharge  Condition: Good  I have discussed the results, Dx and Tx plan with the pt(& family if present). He/she/they expressed understanding and agree(s) with the plan. Discharge instructions discussed at great length. Strict return precautions discussed and pt &/or family have verbalized understanding of the instructions. No further questions at time of discharge.    Discharge Medication List as of 11/05/2014  5:39 PM    START taking these medications   Details  valACYclovir (VALTREX) 1000 MG tablet Take 1 tablet (1,000 mg total) by mouth 3 (three) times daily., Starting 11/05/2014, Until Discontinued, Print        Follow Up: Lewis Moccasin, MD [address] [place] [address] [phone]  Schedule an appointment as soon as possible for a visit in 3 days   [facility] EMERGENCY DEPARTMENT [address] [id number] mc [place] [address] [phone]  If symptoms worsen   Pt  seen in conjunction with Dr. Gwendolyn Lima, DO [facility] Emergency Medicine Resident - PGY-3     Ames Dura, MD 11/05/14 4132  Nelva Nay, MD 11/12/14 [phone]

## 2014-11-05 NOTE — ED Notes (Signed)
Pt from PCP via [facility] with c/o lower bilateral chest/upper abdominal pain x4 days with deep inspiration 6-7/ pain s/p left rotator cuff surgery.  Pt also reports dizziness, lungs clear, EKG SB with PACs.  Pt in NAD, A&O.

## 2014-11-11 NOTE — Anesthesia Postprocedure Evaluation (Signed)
  Anesthesia Post-op Note  Patient: Jeremy Farmer  Procedure(s) Performed: Procedure(s) (LRB): LEFT SHOULDER ARTHROSCOPY WITH SUBACROMIAL DECOMPRESSION, DISTAL CLAVICLE RESECTION, ROTATOR CUFF REPAIR  (Left)  Patient Location: PACU  Anesthesia Type: GA combined with regional for post-op pain  Level of Consciousness: awake and alert   Airway and Oxygen Therapy: Patient Spontanous Breathing  Post-op Pain: mild  Post-op Assessment: Post-op Vital signs reviewed, Patient's Cardiovascular Status Stable, Respiratory Function Stable, Patent Airway and No signs of Nausea or vomiting  Last Vitals:  Filed Vitals:   10/31/14 1528  BP: 121/63  Pulse:   Temp:   Resp: 15    Post-op Vital Signs: stable   Complications: No apparent anesthesia complications

## 2015-03-10 ENCOUNTER — Emergency Department (HOSPITAL_COMMUNITY)
Admission: EM | Admit: 2015-03-10 | Discharge: 2015-03-10 | Disposition: A | Discharge status: Home / Self Care | Payer: Medicare HMO | Attending: Emergency Medicine | Admitting: Emergency Medicine

## 2015-03-10 ENCOUNTER — Emergency Department (HOSPITAL_COMMUNITY): Payer: Medicare HMO

## 2015-03-10 ENCOUNTER — Encounter (HOSPITAL_COMMUNITY): Payer: Self-pay | Admitting: Emergency Medicine

## 2015-03-10 DIAGNOSIS — I10 Essential (primary) hypertension: Secondary | ICD-10-CM | POA: Insufficient documentation

## 2015-03-10 DIAGNOSIS — K219 Gastro-esophageal reflux disease without esophagitis: Secondary | ICD-10-CM | POA: Diagnosis not present

## 2015-03-10 DIAGNOSIS — R319 Hematuria, unspecified: Secondary | ICD-10-CM | POA: Diagnosis present

## 2015-03-10 DIAGNOSIS — Z8673 Personal history of transient ischemic attack (TIA), and cerebral infarction without residual deficits: Secondary | ICD-10-CM | POA: Diagnosis not present

## 2015-03-10 DIAGNOSIS — N419 Inflammatory disease of prostate, unspecified: Secondary | ICD-10-CM | POA: Insufficient documentation

## 2015-03-10 DIAGNOSIS — I861 Scrotal varices: Secondary | ICD-10-CM | POA: Insufficient documentation

## 2015-03-10 DIAGNOSIS — E7800 Pure hypercholesterolemia, unspecified: Secondary | ICD-10-CM | POA: Insufficient documentation

## 2015-03-10 DIAGNOSIS — E119 Type 2 diabetes mellitus without complications: Secondary | ICD-10-CM | POA: Insufficient documentation

## 2015-03-10 DIAGNOSIS — Z79899 Other long term (current) drug therapy: Secondary | ICD-10-CM | POA: Diagnosis not present

## 2015-03-10 DIAGNOSIS — Z7982 Long term (current) use of aspirin: Secondary | ICD-10-CM | POA: Insufficient documentation

## 2015-03-10 DIAGNOSIS — Z794 Long term (current) use of insulin: Secondary | ICD-10-CM | POA: Insufficient documentation

## 2015-03-10 DIAGNOSIS — N50811 Right testicular pain: Secondary | ICD-10-CM

## 2015-03-10 DIAGNOSIS — M1990 Unspecified osteoarthritis, unspecified site: Secondary | ICD-10-CM | POA: Diagnosis not present

## 2015-03-10 LAB — URINALYSIS, ROUTINE W REFLEX MICROSCOPIC
Glucose, UA: NEGATIVE mg/dL
Ketones, ur: NEGATIVE mg/dL
Nitrite: NEGATIVE
PROTEIN: 30 mg/dL — AB
Specific Gravity, Urine: 1.038 — ABNORMAL HIGH (ref 1.005–1.030)
pH: 5.5 (ref 5.0–8.0)

## 2015-03-10 LAB — URINE MICROSCOPIC-ADD ON

## 2015-03-10 MED ORDER — SULFAMETHOXAZOLE-TRIMETHOPRIM 800-160 MG PO TABS: 1.0000 | ORAL_TABLET | Freq: Two times a day (BID) | ORAL | Status: AC

## 2015-03-10 MED ORDER — OXYCODONE-ACETAMINOPHEN 5-325 MG PO TABS: 1.0000 | ORAL_TABLET | Freq: Four times a day (QID) | ORAL | Status: AC | PRN

## 2015-03-10 MED ORDER — HYDROMORPHONE HCL 1 MG/ML IJ SOLN
1.0000 mg | Freq: Once | INTRAMUSCULAR | Status: AC
  Administered 2015-03-10: 1 mg via INTRAMUSCULAR
  Filled 2015-03-10: qty 1

## 2015-03-10 MED ORDER — ONDANSETRON 4 MG PO TBDP
4.0000 mg | ORAL_TABLET | Freq: Once | ORAL | Status: AC
  Administered 2015-03-10: 4 mg via ORAL
  Filled 2015-03-10: qty 1

## 2015-03-10 NOTE — ED Notes (Signed)
MD at bedside. 

## 2015-03-10 NOTE — ED Provider Notes (Signed)
CSN: [id number]     Arrival date & time 03/10/15  0915 History   First MD Initiated Contact with Patient 03/10/15 [phone]     Chief Complaint  Patient presents with  . Testicle Pain  . Hematuria  . Flank Pain     (Consider location/radiation/quality/duration/timing/severity/associated sxs/prior Treatment) Patient is a 64 y.o. male presenting with testicular pain, hematuria, and flank pain. The history is provided by the patient.  Testicle Pain  Hematuria  Flank Pain   patient presents with right-sided testicle pain. Began 4 days ago. States he was doing well 5 days ago. Also has had occasional urinary problems. States his testicle is twice the size used.. States he had this in the past with an infection. No trauma. No fevers. States he has had some chills. No penile discharge.he states he has had some blood in the urine for last 2 days. Also has pain in his right lower abdomen and right flank.  Past Medical History  Diagnosis Date  . Hypertension   . High cholesterol   . GERD (gastroesophageal reflux disease)   . Stroke [facility]) 2009    "mini stroke; denies residual on 06/15/2013)  . Morbid obesity due to excess calories (HCC)   . Mixed hyperlipidemia   . Osteoarthritis   . Impotence of organic origin   . Allergic rhinitis   . Fatigue   . Snoring   . Left shoulder pain   . Type II diabetes mellitus [facility])    Past Surgical History  Procedure Laterality Date  . Shoulder open rotator cuff repair Right 2006  . Shoulder arthroscopy with subacromial decompression, rotator cuff repair and bicep tendon repair Left 10/31/2014    Procedure: LEFT SHOULDER ARTHROSCOPY WITH SUBACROMIAL DECOMPRESSION, DISTAL CLAVICLE RESECTION, ROTATOR CUFF REPAIR ;  Surgeon: Francena Hanly, MD;  Location: [facility] OR;  Service: Orthopedics;  Laterality: Left;   No family history on file. Social History  Substance Use Topics  . Smoking status: Never Smoker   . Smokeless tobacco: Never Used  . Alcohol Use: No     Review of Systems  Constitutional: Positive for chills. Negative for appetite change.  Genitourinary: Positive for hematuria, flank pain and testicular pain. Negative for dysuria.      Allergies  Peanut-containing drug products  Home Medications   Prior to Admission medications   Medication Sig Start Date End Date Taking? Authorizing Provider  acetaminophen (TYLENOL) 500 MG tablet Take 500 mg by mouth daily.   Yes Historical Provider, MD  amLODipine (NORVASC) 5 MG tablet Take 2.5 mg by mouth daily.  07/12/13  Yes Historical Provider, MD  aspirin EC 81 MG tablet Take 81 mg by mouth daily.    Yes Historical Provider, MD  carvedilol (COREG) 12.5 MG tablet Take 25 mg by mouth daily.  07/25/13  Yes Historical Provider, MD  chlorpheniramine-HYDROcodone (TUSSIONEX PENNKINETIC ER) 10-8 MG/5ML LQCR Take 5 mLs by mouth every 12 (twelve) hours as needed for cough. 06/12/14  Yes Mirian Mo, MD  Insulin Glargine (LANTUS SOLOSTAR) 100 UNIT/ML Solostar Pen Inject 20 Units into the skin every morning.    Yes Historical Provider, MD  lisinopril (PRINIVIL,ZESTRIL) 30 MG tablet Take 30 mg by mouth daily.   Yes Historical Provider, MD  metFORMIN (GLUCOPHAGE) 500 MG tablet Take 250 mg by mouth daily.    Yes Historical Provider, MD  Omega-3 Fatty Acids (FISH OIL PO) Take 1 capsule by mouth daily.   Yes Historical Provider, MD  omeprazole (PRILOSEC) 40 MG capsule Take 40 mg  by mouth daily.   Yes Historical Provider, MD  rosuvastatin (CRESTOR) 20 MG tablet Take 20 mg by mouth daily.   Yes Historical Provider, MD  naproxen (NAPROSYN) 500 MG tablet Take 1 tablet (500 mg total) by mouth 2 (two) times daily with a meal. Patient not taking: Reported on 03/10/2015 10/31/14   Ralene Bathe, PA-C  oxyCODONE-acetaminophen (PERCOCET/ROXICET) 5-325 MG tablet Take 1-2 tablets by mouth every 6 (six) hours as needed for severe pain. 03/10/15   Benjiman Core, MD  sulfamethoxazole-trimethoprim (BACTRIM DS,SEPTRA DS)  800-160 MG tablet Take 1 tablet by mouth 2 (two) times daily. 03/10/15 03/17/15  Benjiman Core, MD   BP 118/78 mmHg  Pulse 66  Temp(Src) 98.6 F (37 C) (Oral)  Resp 16  SpO2 100% Physical Exam  Constitutional: He appears well-developed.  Patient appears uncomfortable  HENT:  Head: Atraumatic.  Cardiovascular: Normal rate.   Pulmonary/Chest: Effort normal.  Abdominal: There is tenderness.  Right lower quadrant tenderness without mass.  Genitourinary: Penis normal.  Large testicle on the right side. Moderate to severe tenderness. No hernias palpated.  cremasterics reflex intact.  Musculoskeletal: Normal range of motion.  Neurological: He is alert.  Skin: Skin is warm.    ED Course  Procedures (including critical care time) Labs Review Labs Reviewed  URINALYSIS, ROUTINE W REFLEX MICROSCOPIC (NOT AT [facility]) - Abnormal; Notable for the following:    Color, Urine ORANGE (*)    APPearance CLOUDY (*)    Specific Gravity, Urine 1.038 (*)    Hgb urine dipstick SMALL (*)    Bilirubin Urine SMALL (*)    Protein, ur 30 (*)    Leukocytes, UA SMALL (*)    All other components within normal limits  URINE MICROSCOPIC-ADD ON - Abnormal; Notable for the following:    Squamous Epithelial / LPF 6-30 (*)    Bacteria, UA MANY (*)    Casts HYALINE CASTS (*)    All other components within normal limits    Imaging Review US Scrotum  03/10/2015  CLINICAL DATA:  Three days of right-sided scrotal pain and swelling ; right inguinal pain for the last 5 days. EXAM: SCROTAL ULTRASOUND DOPPLER ULTRASOUND OF THE TESTICLES TECHNIQUE: Complete ultrasound examination of the testicles, epididymis, and other scrotal structures was performed. Color and spectral Doppler ultrasound were also utilized to evaluate blood flow to the testicles. COMPARISON:  None. Scrotal ultrasound of November 30, 2013 FINDINGS: Right testicle Measurements: 4.7 x 2.8 x 3.6 cm. There are multiple microliths noted which are unchanged  from previous study. No suspicious masses are observed. Left testicle Measurements: 5.1 x 3.0 x 2.6 cm. Multiple microliths are present similar to those seen previously. No suspicious masses are observed. No mass or microlithiasis visualized. Right epididymis:  There is a tiny cyst measuring 5 mm in diameter. Left epididymis:  There is a tiny cyst measuring 3 mm in diameter. Hydrocele:  There is a right-sided hydrocele measuring 4.9 x 2.4 cm. Varicocele:  There is a right-sided varicocele. Pulsed Doppler interrogation of both testes demonstrates normal low resistance arterial and venous waveforms bilaterally. IMPRESSION: 1. Extensive stable appearing testicular microlithiasis. No testicular masses are observed. Vascularity of the testes is normal. 2. Tiny epididymal cysts. 3. Moderate-sized right-sided hydrocele and small varicocele. Electronically Signed   By: David  [place] M.D.   On: 03/10/2015 11:00   [place] Art/ven Flow Abd Pelv Doppler  03/10/2015  CLINICAL DATA:  Three days of right-sided scrotal pain and swelling ; right inguinal pain for the last  5 days. EXAM: SCROTAL ULTRASOUND DOPPLER ULTRASOUND OF THE TESTICLES TECHNIQUE: Complete ultrasound examination of the testicles, epididymis, and other scrotal structures was performed. Color and spectral Doppler ultrasound were also utilized to evaluate blood flow to the testicles. COMPARISON:  None. Scrotal ultrasound of November 30, 2013 FINDINGS: Right testicle Measurements: 4.7 x 2.8 x 3.6 cm. There are multiple microliths noted which are unchanged from previous study. No suspicious masses are observed. Left testicle Measurements: 5.1 x 3.0 x 2.6 cm. Multiple microliths are present similar to those seen previously. No suspicious masses are observed. No mass or microlithiasis visualized. Right epididymis:  There is a tiny cyst measuring 5 mm in diameter. Left epididymis:  There is a tiny cyst measuring 3 mm in diameter. Hydrocele:  There is a right-sided hydrocele  measuring 4.9 x 2.4 cm. Varicocele:  There is a right-sided varicocele. Pulsed Doppler interrogation of both testes demonstrates normal low resistance arterial and venous waveforms bilaterally. IMPRESSION: 1. Extensive stable appearing testicular microlithiasis. No testicular masses are observed. Vascularity of the testes is normal. 2. Tiny epididymal cysts. 3. Moderate-sized right-sided hydrocele and small varicocele. Electronically Signed   By: David  [place] M.D.   On: 03/10/2015 11:00   I have personally reviewed and evaluated these images and lab results as part of my medical decision-making.   EKG Interpretation None      MDM   Final diagnoses:  Pain in right testicle  Right varicocele  Prostatitis, unspecified prostatitis type    Patient with severe testicle pain. Ultrasound showed no torsion but did show hydrocele and varicocele. Will have follow with urology. Urinalysis did show possible infection. Doubt orchitis since blood flow was normal on ultrasound. We'll treat as prostatitis and have follow-up with urology.    Benjiman CoreNathan Tamey Wanek, MD 03/10/15 [id number]

## 2015-03-10 NOTE — ED Notes (Signed)
Pt states that sx started on Thursday--rt testicle swelling to "twice the size", hematuria, and rt flank pain to RLQ pain.  Denies NVD.

## 2015-03-10 NOTE — Discharge Instructions (Signed)
Prostatitis The prostate gland is about the size and shape of a walnut. It is located just below your bladder. It produces one of the components of semen, which is made up of sperm and the fluids that help nourish and transport it out from the testicles. Prostatitis is inflammation of the prostate gland.  There are four types of prostatitis:  Acute bacterial prostatitis. This is the least common type of prostatitis. It starts quickly and usually is associated with a bladder infection, high fever, and shaking chills. It can occur at any age.  Chronic bacterial prostatitis. This is a persistent bacterial infection in the prostate. It usually develops from repeated acute bacterial prostatitis or acute bacterial prostatitis that was not properly treated. It can occur in men of any age but is most common in middle-aged men whose prostate has begun to enlarge. The symptoms are not as severe as those in acute bacterial prostatitis. Discomfort in the part of your body that is in front of your rectum and below your scrotum (perineum), lower abdomen, or in the head of your penis (glans) may represent your primary discomfort.  Chronic prostatitis (nonbacterial). This is the most common type of prostatitis. It is inflammation of the prostate gland that is not caused by a bacterial infection. The cause is unknown and may be associated with a viral infection or autoimmune disorder.  Prostatodynia (pelvic floor disorder). This is associated with increased muscular tone in the pelvis surrounding the prostate. CAUSES The causes of bacterial prostatitis are bacterial infection. The causes of the other types of prostatitis are unknown.  SYMPTOMS  Symptoms can vary depending upon the type of prostatitis that exists. There can also be overlap in symptoms. Possible symptoms for each type of prostatitis are listed below. Acute Bacterial Prostatitis  Painful urination.  Fever or chills.  Muscle or joint pains.  Low  back pain.  Low abdominal pain.  Inability to empty bladder completely. Chronic Bacterial Prostatitis, Chronic Nonbacterial Prostatitis, and Prostatodynia  Sudden urge to urinate.  Frequent urination.  Difficulty starting urine stream.  Weak urine stream.  Discharge from the urethra.  Dribbling after urination.  Rectal pain.  Pain in the testicles, penis, or tip of the penis.  Pain in the perineum.  Problems with sexual function.  Painful ejaculation.  Bloody semen. DIAGNOSIS  In order to diagnose prostatitis, your health care provider will ask about your symptoms. One or more urine samples will be taken and tested (urinalysis). If the urinalysis result is negative for bacteria, your health care provider may use a finger to feel your prostate (digital rectal exam). This exam helps your health care provider determine if your prostate is swollen and tender. It will also produce a specimen of semen that can be analyzed. TREATMENT  Treatment for prostatitis depends on the cause. If a bacterial infection is the cause, it can be treated with antibiotic medicine. In cases of chronic bacterial prostatitis, the use of antibiotics for up to 1 month or 6 weeks may be necessary. Your health care provider may instruct you to take sitz baths to help relieve pain. A sitz bath is a bath of hot water in which your hips and buttocks are under water. This relaxes the pelvic floor muscles and often helps to relieve the pressure on your prostate. HOME CARE INSTRUCTIONS   Take all medicines as directed by your health care provider.  Take sitz baths as directed by your health care provider. SEEK MEDICAL CARE IF:   Your symptoms  get worse, not better.  You have a fever. SEEK IMMEDIATE MEDICAL CARE IF:   You have chills.  You feel nauseous or vomit.  You feel lightheaded or faint.  You are unable to urinate.  You have blood or blood clots in your urine. MAKE SURE YOU:  Understand  these instructions.  Will watch your condition.  Will get help right away if you are not doing well or get worse.   This information is not intended to replace advice given to you by your health care provider. Make sure you discuss any questions you have with your health care provider.   Document Released: 03/19/2000 Document Revised: 04/12/2014 Document Reviewed: 10/09/2012 Elsevier Interactive Patient Education 2016 [employer].  Varicocele A varicocele is a swelling of veins in the scrotum. The scrotum is the sac that contains the testicles. Varicoceles can occur on either side of the scrotum, but they are more common on the left side. They occur most often in teenage boys and young men. In most cases, varicoceles are not a serious problem. They are usually small and painless and do not require treatment. Tests may be done to confirm the diagnosis. Treatment may be needed if:  A varicocele is large, causes a lot of pain, or causes pain when exercising.  Varicoceles are found on both sides of the scrotum.  The testicle on the opposite side is absent or not normal.  A varicocele causes a decrease in the size of the testicle in a growing adolescent.  The person has fertility problems. CAUSES This condition is the result of valves in the veins not working properly. Valves in the veins help to return blood from the scrotum and testicles to the heart. If these valves do not work well, blood flows backward and backs up into the veins, which causes the veins to swell. This is similar to what happens when varicose veins form in the leg. SYMPTOMS Most varicoceles do not cause any symptoms. If symptoms do occur, they may include:  Swelling on one side of the scrotum. The swelling may be more obvious when you are standing up.  A lumpy feeling in the scrotum.  A heavy feeling on one side of the scrotum.  A dull ache in the scrotum, especially after exercise or prolonged standing or  sitting.  Slower growth or reduced size of the testicle on the side of the varicocele (in young males).  Problems with fertility. These can occur if the testicle does not grow normally. DIAGNOSIS This condition may be diagnosed with a physical exam. You may also have an imaging test, called an ultrasound, to confirm the diagnosis and to help rule out other causes of the swelling. TREATMENT Treatment is usually not needed for this condition. If you have any pain, your health care provider may prescribe or recommend medicine to help relieve it. You may need regular exams so your health care provider can monitor the varicocele to ensure that it does not cause problems. When further treatment is needed, it may involve one of these options:  Varicocelectomy. This is a surgery in which the swollen veins are tied off so that the flow of blood goes to other veins instead.  Embolization. In this procedure, a small tube (catheter) is used to place metal coils or other blocking items in the veins. This cuts off the blood flow to the swollen veins. HOME CARE INSTRUCTIONS  Take medicines only as directed by your health care provider.  Wear supportive underwear.  Use an athletic supporter for sports.  Keep all follow-up visits as directed by your health care provider. This is important. SEEK MEDICAL CARE IF:  Your pain is increasing.  You have redness in the affected area.  You have swelling that does not decrease when you are lying down.  One of your testicles is smaller than the other.  Your testicle becomes enlarged, swollen, or painful.   This information is not intended to replace advice given to you by your health care provider. Make sure you discuss any questions you have with your health care provider.   Document Released: 06/28/2000 Document Revised: 08/06/2014 Document Reviewed: 02/27/2014 Elsevier Interactive Patient Education [employer]2016 Elsevier Inc.

## 2015-06-11 ENCOUNTER — Encounter (HOSPITAL_COMMUNITY): Payer: Self-pay

## 2015-06-11 ENCOUNTER — Emergency Department (HOSPITAL_COMMUNITY)
Admission: EM | Admit: 2015-06-11 | Discharge: 2015-06-11 | Disposition: A | Discharge status: Home / Self Care | Payer: Commercial Managed Care - HMO | Attending: Emergency Medicine | Admitting: Emergency Medicine

## 2015-06-11 DIAGNOSIS — Z794 Long term (current) use of insulin: Secondary | ICD-10-CM | POA: Diagnosis not present

## 2015-06-11 DIAGNOSIS — Z87438 Personal history of other diseases of male genital organs: Secondary | ICD-10-CM | POA: Diagnosis not present

## 2015-06-11 DIAGNOSIS — Z79899 Other long term (current) drug therapy: Secondary | ICD-10-CM | POA: Insufficient documentation

## 2015-06-11 DIAGNOSIS — R21 Rash and other nonspecific skin eruption: Secondary | ICD-10-CM | POA: Diagnosis present

## 2015-06-11 DIAGNOSIS — K219 Gastro-esophageal reflux disease without esophagitis: Secondary | ICD-10-CM | POA: Insufficient documentation

## 2015-06-11 DIAGNOSIS — Z7982 Long term (current) use of aspirin: Secondary | ICD-10-CM | POA: Insufficient documentation

## 2015-06-11 DIAGNOSIS — E782 Mixed hyperlipidemia: Secondary | ICD-10-CM | POA: Insufficient documentation

## 2015-06-11 DIAGNOSIS — E7800 Pure hypercholesterolemia, unspecified: Secondary | ICD-10-CM | POA: Insufficient documentation

## 2015-06-11 DIAGNOSIS — L509 Urticaria, unspecified: Secondary | ICD-10-CM | POA: Diagnosis not present

## 2015-06-11 DIAGNOSIS — M1990 Unspecified osteoarthritis, unspecified site: Secondary | ICD-10-CM | POA: Diagnosis not present

## 2015-06-11 DIAGNOSIS — Z7984 Long term (current) use of oral hypoglycemic drugs: Secondary | ICD-10-CM | POA: Diagnosis not present

## 2015-06-11 DIAGNOSIS — E119 Type 2 diabetes mellitus without complications: Secondary | ICD-10-CM | POA: Diagnosis not present

## 2015-06-11 DIAGNOSIS — Z8673 Personal history of transient ischemic attack (TIA), and cerebral infarction without residual deficits: Secondary | ICD-10-CM | POA: Insufficient documentation

## 2015-06-11 DIAGNOSIS — I10 Essential (primary) hypertension: Secondary | ICD-10-CM | POA: Insufficient documentation

## 2015-06-11 MED ORDER — METHYLPREDNISOLONE SODIUM SUCC 125 MG IJ SOLR
125.0000 mg | Freq: Once | INTRAMUSCULAR | Status: AC
  Administered 2015-06-11: 125 mg via INTRAVENOUS
  Filled 2015-06-11: qty 2

## 2015-06-11 MED ORDER — EPINEPHRINE 0.3 MG/0.3ML IJ SOAJ: 0.3000 mg | Freq: Once | INTRAMUSCULAR | Status: AC

## 2015-06-11 MED ORDER — FAMOTIDINE IN NACL 20-0.9 MG/50ML-% IV SOLN
20.0000 mg | Freq: Once | INTRAVENOUS | Status: AC
  Administered 2015-06-11: 20 mg via INTRAVENOUS
  Filled 2015-06-11: qty 50

## 2015-06-11 MED ORDER — DIPHENHYDRAMINE HCL 50 MG/ML IJ SOLN
25.0000 mg | Freq: Once | INTRAMUSCULAR | Status: AC
  Administered 2015-06-11: 25 mg via INTRAVENOUS
  Filled 2015-06-11: qty 1

## 2015-06-11 NOTE — ED Provider Notes (Signed)
CSN: [id number]     Arrival date & time 06/11/15  1051 History   First MD Initiated Contact with Patient 06/11/15 1147     Chief Complaint  Patient presents with  . Rash     (Consider location/radiation/quality/duration/timing/severity/associated sxs/prior Treatment) HPI   65 year old male with hand swelling, rash for the past 2 days some mild lip swelling which started today. Rash is red and itches. Says his face feels puffy. No wheezing or shortness of breath. No dizziness or lightheadedness. No abdominal pain/cramping. No nausea, vomiting or diarrhea. No new exposures that he is aware of. He has not tried taking anything for his symptoms. He is on an ACE inhibitor.  Past Medical History  Diagnosis Date  . Hypertension   . High cholesterol   . GERD (gastroesophageal reflux disease)   . Stroke [facility]) 2009    "mini stroke; denies residual on 06/15/2013)  . Morbid obesity due to excess calories (HCC)   . Mixed hyperlipidemia   . Osteoarthritis   . Impotence of organic origin   . Allergic rhinitis   . Fatigue   . Snoring   . Left shoulder pain   . Type II diabetes mellitus [facility])    Past Surgical History  Procedure Laterality Date  . Shoulder open rotator cuff repair Right 2006  . Shoulder arthroscopy with subacromial decompression, rotator cuff repair and bicep tendon repair Left 10/31/2014    Procedure: LEFT SHOULDER ARTHROSCOPY WITH SUBACROMIAL DECOMPRESSION, DISTAL CLAVICLE RESECTION, ROTATOR CUFF REPAIR ;  Surgeon: Francena Hanly, MD;  Location: [facility] OR;  Service: Orthopedics;  Laterality: Left;   History reviewed. No pertinent family history. Social History  Substance Use Topics  . Smoking status: Never Smoker   . Smokeless tobacco: Never Used  . Alcohol Use: No    Review of Systems  All systems reviewed and negative, other than as noted in HPI.   Allergies  Peanut-containing drug products  Home Medications   Prior to Admission medications   Medication Sig Start  Date End Date Taking? Authorizing Provider  amLODipine (NORVASC) 5 MG tablet Take 2.5 mg by mouth daily.  07/12/13  Yes Historical Provider, MD  aspirin EC 81 MG tablet Take 81 mg by mouth daily.    Yes Historical Provider, MD  carvedilol (COREG) 25 MG tablet Take 25 mg by mouth daily. 04/09/15  Yes Historical Provider, MD  ibuprofen (ADVIL,MOTRIN) 200 MG tablet Take 200 mg by mouth every 6 (six) hours as needed for moderate pain.   Yes Historical Provider, MD  Insulin Glargine (LANTUS SOLOSTAR) 100 UNIT/ML Solostar Pen Inject 20 Units into the skin every morning.    Yes Historical Provider, MD  lisinopril (PRINIVIL,ZESTRIL) 30 MG tablet Take 30 mg by mouth daily.   Yes Historical Provider, MD  metFORMIN (GLUCOPHAGE) 500 MG tablet Take 250 mg by mouth 2 (two) times daily.    Yes Historical Provider, MD  Omega-3 Fatty Acids (FISH OIL PO) Take 1 capsule by mouth daily.   Yes Historical Provider, MD  omeprazole (PRILOSEC) 40 MG capsule Take 40 mg by mouth daily.   Yes Historical Provider, MD  oxyCODONE-acetaminophen (PERCOCET/ROXICET) 5-325 MG tablet Take 1-2 tablets by mouth every 6 (six) hours as needed for severe pain. 03/10/15  Yes Benjiman Core, MD  rosuvastatin (CRESTOR) 20 MG tablet Take 20 mg by mouth daily.   Yes Historical Provider, MD  EPINEPHrine 0.3 mg/0.3 mL IJ SOAJ injection Inject 0.3 mLs (0.3 mg total) into the muscle once. 06/11/15   Jeannett Senior  Alexiana Laverdure, MD   BP 103/67 mmHg  Pulse 69  Temp(Src) 97.6 F (36.4 C) (Oral)  Resp 19  SpO2 97% Physical Exam  Constitutional: He appears well-developed and well-nourished. No distress.  HENT:  Head: Normocephalic and atraumatic.  Mild swelling of upper and lower lips. Tongue is normal in appearance. Handling secretions. Normal sounding voice. Neck is supple. No stridor.  Eyes: Conjunctivae are normal. Right eye exhibits no discharge. Left eye exhibits no discharge.  Neck: Neck supple.  Cardiovascular: Normal rate, regular rhythm and normal heart  sounds.  Exam reveals no gallop and no friction rub.   No murmur heard. Pulmonary/Chest: Effort normal and breath sounds normal. No respiratory distress.  Abdominal: Soft. He exhibits no distension. There is no tenderness.  Musculoskeletal: He exhibits no edema or tenderness.  Neurological: He is alert.  Skin: Skin is warm and dry. Rash noted.  Urticarial appearing rash to bilateral upper extremities and chest.  Psychiatric: He has a normal mood and affect. His behavior is normal. Thought content normal.  Nursing note and vitals reviewed.   ED Course  Procedures (including critical care time) Labs Review Labs Reviewed - No data to display  Imaging Review No results found. I have personally reviewed and evaluated these images and lab results as part of my medical decision-making.   EKG Interpretation None      MDM   Final diagnoses:  Urticaria    Symptoms progressively improving although not completely resolved. He remains hemodynamically stable. He has no respiratory complaints. Consider angioedema particularly with being on an ACE inhibitor. He has a diffuse urticarial rash though which is not consistent with angioedema. At this point I would not necessarily stop his ACE inhibitor but patient was strongly cautioned.  He feels like his mild swelling is persisting or certainly if it is worsening that he needs to be reevaluated.At this point, I feel he is appropriate for discharge. Mr. Arvilla MarketMills will be discharged with a prescription for epinephrine pens. Indications for usage were reviewed. Other return precautions were discussed as well.    Raeford RazorStephen Jadarion Halbig, MD 06/15/15 2154

## 2015-06-11 NOTE — Discharge Instructions (Signed)
Hives Hives are itchy, red, swollen areas of the skin. They can vary in size and location on your body. Hives can come and go for hours or several days (acute hives) or for several weeks (chronic hives). Hives do not spread from person to person (noncontagious). They may get worse with scratching, exercise, and emotional stress. CAUSES   Allergic reaction to food, additives, or drugs.  Infections, including the common cold.  Illness, such as vasculitis, lupus, or thyroid disease.  Exposure to sunlight, heat, or cold.  Exercise.  Stress.  Contact with chemicals. SYMPTOMS   Red or white swollen patches on the skin. The patches may change size, shape, and location quickly and repeatedly.  Itching.  Swelling of the hands, feet, and face. This may occur if hives develop deeper in the skin. DIAGNOSIS  Your caregiver can usually tell what is wrong by performing a physical exam. Skin or blood tests may also be done to determine the cause of your hives. In some cases, the cause cannot be determined. TREATMENT  Mild cases usually get better with medicines such as antihistamines. Severe cases may require an emergency epinephrine injection. If the cause of your hives is known, treatment includes avoiding that trigger.  HOME CARE INSTRUCTIONS   Avoid causes that trigger your hives.  Take antihistamines as directed by your caregiver to reduce the severity of your hives. Non-sedating or low-sedating antihistamines are usually recommended. Do not drive while taking an antihistamine.  Take any other medicines prescribed for itching as directed by your caregiver.  Wear loose-fitting clothing.  Keep all follow-up appointments as directed by your caregiver. SEEK MEDICAL CARE IF:   You have persistent or severe itching that is not relieved with medicine.  You have painful or swollen joints. SEEK IMMEDIATE MEDICAL CARE IF:   You have a fever.  Your tongue or lips are swollen.  You have  trouble breathing or swallowing.  You feel tightness in the throat or chest.  You have abdominal pain. These problems may be the first sign of a life-threatening allergic reaction. Call your local emergency services (911 in [place] MAKE SURE YOU:   Understand these instructions.  Will watch your condition.  Will get help right away if you are not doing well or get worse.   This information is not intended to replace advice given to you by your health care provider. Make sure you discuss any questions you have with your health care provider.   Document Released: 03/22/2005 Document Revised: 03/27/2013 Document Reviewed: 06/15/2011 Elsevier Interactive Patient Education 2016 [employer].  

## 2015-06-11 NOTE — ED Notes (Signed)
Pt with hand swelling and itching x 2 days.  Lip swelling starting today.  Pt states throat is scratchy and breathing is changing and more difficult.  Pt able to make full sentences and in no acute distress at this time.

## 2017-04-21 ENCOUNTER — Other Ambulatory Visit: Payer: Self-pay | Admitting: Physical Medicine & Rehabilitation

## 2017-04-24 ENCOUNTER — Other Ambulatory Visit: Payer: Self-pay | Admitting: Physical Medicine & Rehabilitation

## 2017-04-24 DIAGNOSIS — M25561 Pain in right knee: Secondary | ICD-10-CM

## 2017-04-29 ENCOUNTER — Inpatient Hospital Stay
Admission: RE | Admit: 2017-04-29 | Discharge: 2017-04-29 | Disposition: A | Discharge status: Home / Self Care | Payer: Self-pay | Source: Ambulatory Visit | Attending: Physical Medicine & Rehabilitation | Admitting: Physical Medicine & Rehabilitation

## 2017-05-05 ENCOUNTER — Other Ambulatory Visit: Payer: Self-pay

## 2017-05-16 ENCOUNTER — Inpatient Hospital Stay
Admission: RE | Admit: 2017-05-16 | Discharge: 2017-05-16 | Disposition: A | Discharge status: Home / Self Care | Payer: Self-pay | Source: Ambulatory Visit | Attending: Physical Medicine & Rehabilitation | Admitting: Physical Medicine & Rehabilitation

## 2019-12-24 ENCOUNTER — Encounter (HOSPITAL_COMMUNITY): Payer: Self-pay | Admitting: Emergency Medicine

## 2019-12-24 ENCOUNTER — Other Ambulatory Visit: Payer: Self-pay

## 2019-12-24 ENCOUNTER — Emergency Department (HOSPITAL_COMMUNITY): Payer: No Typology Code available for payment source

## 2019-12-24 ENCOUNTER — Emergency Department (HOSPITAL_COMMUNITY)
Admission: EM | Admit: 2019-12-24 | Discharge: 2019-12-25 | Disposition: A | Discharge status: Home / Self Care | Payer: No Typology Code available for payment source | Attending: Emergency Medicine | Admitting: Emergency Medicine

## 2019-12-24 DIAGNOSIS — Z7982 Long term (current) use of aspirin: Secondary | ICD-10-CM | POA: Insufficient documentation

## 2019-12-24 DIAGNOSIS — E1110 Type 2 diabetes mellitus with ketoacidosis without coma: Secondary | ICD-10-CM | POA: Diagnosis not present

## 2019-12-24 DIAGNOSIS — Z79899 Other long term (current) drug therapy: Secondary | ICD-10-CM | POA: Insufficient documentation

## 2019-12-24 DIAGNOSIS — Z91010 Allergy to peanuts: Secondary | ICD-10-CM | POA: Insufficient documentation

## 2019-12-24 DIAGNOSIS — I10 Essential (primary) hypertension: Secondary | ICD-10-CM | POA: Insufficient documentation

## 2019-12-24 DIAGNOSIS — Z794 Long term (current) use of insulin: Secondary | ICD-10-CM | POA: Insufficient documentation

## 2019-12-24 DIAGNOSIS — X500XXA Overexertion from strenuous movement or load, initial encounter: Secondary | ICD-10-CM | POA: Insufficient documentation

## 2019-12-24 DIAGNOSIS — Y990 Civilian activity done for income or pay: Secondary | ICD-10-CM | POA: Insufficient documentation

## 2019-12-24 DIAGNOSIS — M25511 Pain in right shoulder: Secondary | ICD-10-CM | POA: Diagnosis not present

## 2019-12-24 NOTE — ED Triage Notes (Signed)
Patient hurt his right shoulder at work lifting a box of ice.  The boxes are 40lbs. Patient is able to lift but it is painful.

## 2019-12-25 MED ORDER — NAPROXEN 500 MG PO TABS: 500.0000 mg | ORAL_TABLET | Freq: Two times a day (BID) | ORAL | 0 refills | Status: AC

## 2019-12-25 MED ORDER — KETOROLAC TROMETHAMINE 60 MG/2ML IM SOLN
60.0000 mg | Freq: Once | INTRAMUSCULAR | Status: AC
  Administered 2019-12-25: 60 mg via INTRAMUSCULAR
  Filled 2019-12-25: qty 2

## 2019-12-25 MED ORDER — HYDROCODONE-ACETAMINOPHEN 5-325 MG PO TABS: 1.0000 | ORAL_TABLET | Freq: Four times a day (QID) | ORAL | 0 refills | Status: AC | PRN

## 2019-12-25 NOTE — Discharge Instructions (Signed)
Wear arm sling for the next several days, then gradually reintroduce activity as tolerated.  Take naproxen 500 mg twice daily as prescribed for the next week.  Take hydrocodone as prescribed as needed for pain not relieved with ibuprofen.  Light duty for the next week.  Follow-up with your primary doctor if symptoms or not improving in the next week.

## 2019-12-25 NOTE — ED Provider Notes (Signed)
[facility] EMERGENCY DEPARTMENT Provider Note   CSN: [id number] Arrival date & time: 12/24/19  2047     History Chief Complaint  Patient presents with  . Shoulder Pain    Jeremy Farmer is a 69 y.o. male.  Patient is a 69 year old male with past medical history of hypertension, hyperlipidemia, GERD, diabetes.  He presents today for evaluation of right shoulder pain.  Patient was at work earlier today when he lifted a 40 pound box of ice.  He felt a pull in his right shoulder and has had significant discomfort since.  He denies any radiation of his pain down his arm.  He denies any numbness or tingling.  He denies any weakness.  There are no alleviating factors.  His pain is worse with range of motion and palpation.  The history is provided by the patient.  Shoulder Pain Location:  Shoulder Shoulder location:  R shoulder Injury: yes   Time since incident:  12 hours Pain details:    Quality:  Throbbing   Radiates to:  Does not radiate   Severity:  Moderate   Onset quality:  Sudden      Past Medical History:  Diagnosis Date  . Allergic rhinitis   . Fatigue   . GERD (gastroesophageal reflux disease)   . High cholesterol   . Hypertension   . Impotence of organic origin   . Left shoulder pain   . Mixed hyperlipidemia   . Morbid obesity due to excess calories (HCC)   . Osteoarthritis   . Snoring   . Stroke [facility]) 2009   "mini stroke; denies residual on 06/15/2013)  . Type II diabetes mellitus [facility])     Patient Active Problem List   Diagnosis Date Noted  . DKA (diabetic ketoacidoses) (HCC) 07/10/2013  . Hypertension 07/10/2013  . Hyperlipidemia 07/10/2013    Past Surgical History:  Procedure Laterality Date  . SHOULDER ARTHROSCOPY WITH SUBACROMIAL DECOMPRESSION, ROTATOR CUFF REPAIR AND BICEP TENDON REPAIR Left 10/31/2014   Procedure: LEFT SHOULDER ARTHROSCOPY WITH SUBACROMIAL DECOMPRESSION, DISTAL CLAVICLE RESECTION, ROTATOR CUFF REPAIR ;  Surgeon:  Francena Hanly, MD;  Location: [facility] OR;  Service: Orthopedics;  Laterality: Left;  . SHOULDER OPEN ROTATOR CUFF REPAIR Right 2006       No family history on file.  Social History   Tobacco Use  . Smoking status: Never Smoker  . Smokeless tobacco: Never Used  Substance Use Topics  . Alcohol use: No  . Drug use: No    Home Medications Prior to Admission medications   Medication Sig Start Date End Date Taking? Authorizing Provider  amLODipine (NORVASC) 5 MG tablet Take 2.5 mg by mouth daily.  07/12/13   [provider]  aspirin EC 81 MG tablet Take 81 mg by mouth daily.     [provider]  carvedilol (COREG) 25 MG tablet Take 25 mg by mouth daily. 04/09/15   [provider]  EPINEPHrine 0.3 mg/0.3 mL IJ SOAJ injection Inject 0.3 mLs (0.3 mg total) into the muscle once. 06/11/15   Raeford Razor, MD  ibuprofen (ADVIL,MOTRIN) 200 MG tablet Take 200 mg by mouth every 6 (six) hours as needed for moderate pain.    [provider]  Insulin Glargine (LANTUS SOLOSTAR) 100 UNIT/ML Solostar Pen Inject 20 Units into the skin every morning.     [provider]  lisinopril (PRINIVIL,ZESTRIL) 30 MG tablet Take 30 mg by mouth daily.    [provider]  metFORMIN (GLUCOPHAGE) 500  MG tablet Take 250 mg by mouth 2 (two) times daily.     [provider]  Omega-3 Fatty Acids (FISH OIL PO) Take 1 capsule by mouth daily.    [provider]  omeprazole (PRILOSEC) 40 MG capsule Take 40 mg by mouth daily.    [provider]  oxyCODONE-acetaminophen (PERCOCET/ROXICET) 5-325 MG tablet Take 1-2 tablets by mouth every 6 (six) hours as needed for severe pain. 03/10/15   Benjiman Core, MD  rosuvastatin (CRESTOR) 20 MG tablet Take 20 mg by mouth daily.    [provider]    Allergies    Peanut-containing drug products  Review of Systems   Review of Systems  All other systems reviewed and are negative.   Physical Exam Updated  Vital Signs BP (!) 147/61   Pulse (!) 55   Temp 98.2 F (36.8 C) (Oral)   Resp 16   SpO2 100%   Physical Exam Vitals and nursing note reviewed.  Constitutional:      General: He is not in acute distress.    Appearance: Normal appearance. He is not ill-appearing.  HENT:     Head: Normocephalic and atraumatic.  Pulmonary:     Effort: Pulmonary effort is normal.  Musculoskeletal:     Comments: There is tenderness to palpation over the top of the shoulder in the area of the [facility] joint.  Patient has pain with range of motion, mostly with abduction.  Ulnar and radial pulses are easily palpable and sensation is intact throughout the entire hand.  He is able to flex, extend, and oppose all fingers.  Skin:    General: Skin is warm and dry.  Neurological:     Mental Status: He is alert.     ED Results / Procedures / Treatments   Labs (all labs ordered are listed, but only abnormal results are displayed) Labs Reviewed - No data to display  EKG None  Radiology DG Shoulder Right  Result Date: 12/24/2019 CLINICAL DATA:  Initial evaluation for acute pain status post lifting injury. EXAM: RIGHT SHOULDER - 2+ VIEW COMPARISON:  Prior radiograph from 06/13/2007. FINDINGS: No acute fracture dislocation. Humeral head in normal alignment with the glenoid. AC joint approximated. Suture anchors in place at the humeral head, compatible with prior rotator cuff repair. Underlying mild osteoarthritic changes about the shoulder. No visible soft tissue injury. Visualized right hemithorax clear. IMPRESSION: 1. No acute osseous abnormality about the right shoulder. 2. Sequelae of prior rotator cuff repair. Electronically Signed   By: Rise Mu M.D.   On: 12/24/2019 21:26    Procedures Procedures (including critical care time)  Medications Ordered in ED Medications  ketorolac (TORADOL) injection 60 mg (has no administration in time range)    ED Course  I have reviewed the triage vital signs  and the nursing notes.  Pertinent labs & imaging results that were available during my care of the patient were reviewed by me and considered in my medical decision making (see chart for details).    MDM Rules/Calculators/A&P  Patient with what appears to be a strain/sprain of the right AC joint.  X-rays are negative for fracture or dislocation.  Patient to be given IM Toradol and placed in an arm sling.  He will be discharged with a course of an anti-inflammatory, pain medication, rest, and follow-up with primary doctor if not improving in the next week.  Final Clinical Impression(s) / ED Diagnoses Final diagnoses:  None    Rx / DC Orders  ED Discharge Orders    None       Geoffery Lyons, MD 12/25/19 [phone]

## 2020-02-10 ENCOUNTER — Other Ambulatory Visit: Payer: Self-pay

## 2020-02-10 ENCOUNTER — Emergency Department (HOSPITAL_BASED_OUTPATIENT_CLINIC_OR_DEPARTMENT_OTHER)
Admission: EM | Admit: 2020-02-10 | Discharge: 2020-02-10 | Disposition: A | Discharge status: Home / Self Care | Payer: Medicare Other | Attending: Emergency Medicine | Admitting: Emergency Medicine

## 2020-02-10 ENCOUNTER — Emergency Department (HOSPITAL_BASED_OUTPATIENT_CLINIC_OR_DEPARTMENT_OTHER): Payer: Medicare Other

## 2020-02-10 DIAGNOSIS — Z7984 Long term (current) use of oral hypoglycemic drugs: Secondary | ICD-10-CM | POA: Insufficient documentation

## 2020-02-10 DIAGNOSIS — Z7982 Long term (current) use of aspirin: Secondary | ICD-10-CM | POA: Insufficient documentation

## 2020-02-10 DIAGNOSIS — Z91010 Allergy to peanuts: Secondary | ICD-10-CM | POA: Insufficient documentation

## 2020-02-10 DIAGNOSIS — R0789 Other chest pain: Secondary | ICD-10-CM | POA: Diagnosis not present

## 2020-02-10 DIAGNOSIS — R0602 Shortness of breath: Secondary | ICD-10-CM | POA: Insufficient documentation

## 2020-02-10 DIAGNOSIS — J181 Lobar pneumonia, unspecified organism: Secondary | ICD-10-CM | POA: Diagnosis not present

## 2020-02-10 DIAGNOSIS — Z20822 Contact with and (suspected) exposure to covid-19: Secondary | ICD-10-CM | POA: Insufficient documentation

## 2020-02-10 DIAGNOSIS — Z79899 Other long term (current) drug therapy: Secondary | ICD-10-CM | POA: Diagnosis not present

## 2020-02-10 DIAGNOSIS — Z794 Long term (current) use of insulin: Secondary | ICD-10-CM | POA: Insufficient documentation

## 2020-02-10 DIAGNOSIS — I10 Essential (primary) hypertension: Secondary | ICD-10-CM | POA: Insufficient documentation

## 2020-02-10 DIAGNOSIS — E1110 Type 2 diabetes mellitus with ketoacidosis without coma: Secondary | ICD-10-CM | POA: Insufficient documentation

## 2020-02-10 DIAGNOSIS — R059 Cough, unspecified: Secondary | ICD-10-CM | POA: Diagnosis present

## 2020-02-10 DIAGNOSIS — J189 Pneumonia, unspecified organism: Secondary | ICD-10-CM

## 2020-02-10 LAB — CBC WITH DIFFERENTIAL/PLATELET
Abs Immature Granulocytes: 0.01 10*3/uL (ref 0.00–0.07)
Basophils Absolute: 0.1 10*3/uL (ref 0.0–0.1)
Basophils Relative: 1 %
Eosinophils Absolute: 0.2 10*3/uL (ref 0.0–0.5)
Eosinophils Relative: 4 %
HCT: 42.9 % (ref 39.0–52.0)
Hemoglobin: 14.3 g/dL (ref 13.0–17.0)
Immature Granulocytes: 0 %
Lymphocytes Relative: 49 %
Lymphs Abs: 2.6 10*3/uL (ref 0.7–4.0)
MCH: 31.6 pg (ref 26.0–34.0)
MCHC: 33.3 g/dL (ref 30.0–36.0)
MCV: 94.9 fL (ref 80.0–100.0)
Monocytes Absolute: 0.6 10*3/uL (ref 0.1–1.0)
Monocytes Relative: 10 %
Neutro Abs: 2 10*3/uL (ref 1.7–7.7)
Neutrophils Relative %: 36 %
Platelets: 422 10*3/uL — ABNORMAL HIGH (ref 150–400)
RBC: 4.52 MIL/uL (ref 4.22–5.81)
RDW: 13.4 % (ref 11.5–15.5)
WBC: 5.4 10*3/uL (ref 4.0–10.5)
nRBC: 0 % (ref 0.0–0.2)

## 2020-02-10 LAB — BRAIN NATRIURETIC PEPTIDE: B Natriuretic Peptide: 63.8 pg/mL (ref 0.0–100.0)

## 2020-02-10 LAB — BASIC METABOLIC PANEL
Anion gap: 12 (ref 5–15)
BUN: 17 mg/dL (ref 8–23)
CO2: 23 mmol/L (ref 22–32)
Calcium: 9.1 mg/dL (ref 8.9–10.3)
Chloride: 104 mmol/L (ref 98–111)
Creatinine, Ser: 0.86 mg/dL (ref 0.61–1.24)
GFR, Estimated: >60 mL/min (ref >60)
Glucose, Bld: 94 mg/dL (ref 70–99)
Potassium: 4.1 mmol/L (ref 3.5–5.1)
Sodium: 139 mmol/L (ref 135–145)

## 2020-02-10 LAB — RESPIRATORY PANEL BY RT PCR (FLU A&B, COVID)
Influenza A by PCR: NEGATIVE
Influenza B by PCR: NEGATIVE
SARS Coronavirus 2 by RT PCR: NEGATIVE

## 2020-02-10 LAB — TROPONIN I (HIGH SENSITIVITY)
Troponin I (High Sensitivity): 9 ng/L (ref <18)
Troponin I (High Sensitivity): 8 ng/L (ref <18)

## 2020-02-10 MED ORDER — SODIUM CHLORIDE 0.9 % IV SOLN
INTRAVENOUS | Status: DC | PRN
  Administered 2020-02-10: 250 mL via INTRAVENOUS

## 2020-02-10 MED ORDER — AZITHROMYCIN 250 MG PO TABS
500.0000 mg | ORAL_TABLET | Freq: Once | ORAL | Status: AC
  Administered 2020-02-10: 500 mg via ORAL
  Filled 2020-02-10: qty 2

## 2020-02-10 MED ORDER — AZITHROMYCIN 250 MG PO TABS: 250.0000 mg | ORAL_TABLET | Freq: Every day | ORAL | 0 refills | Status: AC

## 2020-02-10 MED ORDER — IOHEXOL 350 MG/ML SOLN
100.0000 mL | Freq: Once | INTRAVENOUS | Status: AC | PRN
  Administered 2020-02-10: 100 mL via INTRAVENOUS

## 2020-02-10 MED ORDER — SODIUM CHLORIDE 0.9 % IV SOLN
1.0000 g | Freq: Once | INTRAVENOUS | Status: AC
  Administered 2020-02-10: 1 g via INTRAVENOUS
  Filled 2020-02-10: qty 10

## 2020-02-10 NOTE — ED Notes (Signed)
Pt in gown, on monitor. Pt denies any needs at this time.

## 2020-02-10 NOTE — Discharge Instructions (Signed)
Work-up here Covid testing negative.  Work-up for any chest pain noncardiac in nature negative.  No evidence of any blood clots in the lungs.  But does raise concerns for right lower lobe pneumonia.  Take the antibiotic Zithromax as directed.  You did receive some antibiotics here prior to discharge.  Make an appointment follow-up with your doctor.  Return for any new or worse symptoms.6

## 2020-02-10 NOTE — ED Notes (Signed)
ED Provider at bedside. 

## 2020-02-10 NOTE — ED Notes (Signed)
Pt endorse productive cough for "couple days"

## 2020-02-10 NOTE — ED Notes (Signed)
Ginger Ale and graham crackers provided per pt request

## 2020-02-10 NOTE — ED Notes (Signed)
Patient transported to CT 

## 2020-02-10 NOTE — ED Triage Notes (Signed)
Pt endorses CP that started last night, shob that started this morning. Pt reports red tinged mucus.

## 2020-02-10 NOTE — ED Notes (Signed)
Spoke with patients daughter and pts wife on speaker. Updated on care being provided.

## 2020-02-10 NOTE — ED Notes (Signed)
Portable Xray at bedside.

## 2020-02-10 NOTE — ED Notes (Signed)
Spoke with Carol in lab  to add on BNP 

## 2020-02-10 NOTE — ED Provider Notes (Signed)
[facility] EMERGENCY DEPARTMENT Provider Note   CSN: [id number] Arrival date & time: 02/10/20  4193     History Chief Complaint  Patient presents with  . Chest Pain    Jeremy Farmer is a 69 y.o. male.  Patient with onset of cough 2 days ago.  Coughing up some mucus with some blood tingeing.  None of that today.  Chest pain anterior location started yesterday.  The chest pain is made worse by taking a deep breath and by coughing.  Patient's had all of his Covid vaccines including the booster.  His care is normally received at [facility].  Past medical history sniffing for hypertension high cholesterol mini stroke in 2009.  Obesity.  And type 2 diabetes.  Patient is on Coreg.  Patient does have some feeling of shortness of breath.  Per tickly when coughing.        Past Medical History:  Diagnosis Date  . Allergic rhinitis   . Fatigue   . GERD (gastroesophageal reflux disease)   . High cholesterol   . Hypertension   . Impotence of organic origin   . Left shoulder pain   . Mixed hyperlipidemia   . Morbid obesity due to excess calories (HCC)   . Osteoarthritis   . Snoring   . Stroke [facility]) 2009   "mini stroke; denies residual on 06/15/2013)  . Type II diabetes mellitus [facility])     Patient Active Problem List   Diagnosis Date Noted  . DKA (diabetic ketoacidoses) 07/10/2013  . Hypertension 07/10/2013  . Hyperlipidemia 07/10/2013    Past Surgical History:  Procedure Laterality Date  . SHOULDER ARTHROSCOPY WITH SUBACROMIAL DECOMPRESSION, ROTATOR CUFF REPAIR AND BICEP TENDON REPAIR Left 10/31/2014   Procedure: LEFT SHOULDER ARTHROSCOPY WITH SUBACROMIAL DECOMPRESSION, DISTAL CLAVICLE RESECTION, ROTATOR CUFF REPAIR ;  Surgeon: Francena Hanly, MD;  Location: [facility] OR;  Service: Orthopedics;  Laterality: Left;  . SHOULDER OPEN ROTATOR CUFF REPAIR Right 2006       No family history on file.  Social History   Tobacco Use  . Smoking status: Never Smoker  .  Smokeless tobacco: Never Used  Substance Use Topics  . Alcohol use: No  . Drug use: No    Home Medications Prior to Admission medications   Medication Sig Start Date End Date Taking? Authorizing Provider  amLODipine (NORVASC) 5 MG tablet Take 2.5 mg by mouth daily.  07/12/13   [provider]  aspirin EC 81 MG tablet Take 81 mg by mouth daily.     [provider]  azithromycin (ZITHROMAX) 250 MG tablet Take 1 tablet (250 mg total) by mouth daily. Take first 2 tablets together, then 1 every day until finished. 02/10/20   Vanetta Mulders, MD  carvedilol (COREG) 25 MG tablet Take 25 mg by mouth daily. 04/09/15   [provider]  EPINEPHrine 0.3 mg/0.3 mL IJ SOAJ injection Inject 0.3 mLs (0.3 mg total) into the muscle once. 06/11/15   Raeford Razor, MD  HYDROcodone-acetaminophen (NORCO) 5-325 MG tablet Take 1-2 tablets by mouth every 6 (six) hours as needed. 12/25/19   Geoffery Lyons, MD  ibuprofen (ADVIL,MOTRIN) 200 MG tablet Take 200 mg by mouth every 6 (six) hours as needed for moderate pain.    [provider]  Insulin Glargine (LANTUS SOLOSTAR) 100 UNIT/ML Solostar Pen Inject 20 Units into the skin every morning.     [provider]  lisinopril (PRINIVIL,ZESTRIL) 30 MG tablet Take 30 mg by mouth daily.  [provider]  metFORMIN (GLUCOPHAGE) 500 MG tablet Take 250 mg by mouth 2 (two) times daily.     [provider]  naproxen (NAPROSYN) 500 MG tablet Take 1 tablet (500 mg total) by mouth 2 (two) times daily with a meal. 12/25/19   Geoffery Lyonselo, Douglas, MD  Omega-3 Fatty Acids (FISH OIL PO) Take 1 capsule by mouth daily.    [provider]  omeprazole (PRILOSEC) 40 MG capsule Take 40 mg by mouth daily.    [provider]  oxyCODONE-acetaminophen (PERCOCET/ROXICET) 5-325 MG tablet Take 1-2 tablets by mouth every 6 (six) hours as needed for severe pain. 03/10/15   Benjiman CorePickering, Nathan, MD  rosuvastatin (CRESTOR) 20 MG tablet Take  20 mg by mouth daily.    [provider]    Allergies    Peanut-containing drug products  Review of Systems   Review of Systems  Constitutional: Negative for chills and fever.  HENT: Positive for congestion. Negative for rhinorrhea and sore throat.   Eyes: Negative for visual disturbance.  Respiratory: Positive for choking and shortness of breath. Negative for cough.   Cardiovascular: Positive for chest pain. Negative for leg swelling.  Gastrointestinal: Negative for abdominal pain, diarrhea, nausea and vomiting.  Genitourinary: Negative for dysuria.  Musculoskeletal: Negative for back pain and neck pain.  Skin: Negative for rash.  Neurological: Negative for dizziness, light-headedness and headaches.  Hematological: Does not bruise/bleed easily.  Psychiatric/Behavioral: Negative for confusion.    Physical Exam Updated Vital Signs BP (!) 143/71 Comment: RA  Pulse (!) 56 Comment: RA  Temp 97.9 F (36.6 C) (Oral)   Resp (!) 27 Comment: RA  Ht 1.753 m (5\' 9" )   Wt 124.3 kg   SpO2 96% Comment: RA  BMI 40.46 kg/m   Physical Exam Vitals and nursing note reviewed.  Constitutional:      Appearance: Normal appearance. He is well-developed.  HENT:     Head: Normocephalic and atraumatic.  Eyes:     Extraocular Movements: Extraocular movements intact.     Conjunctiva/sclera: Conjunctivae normal.     Pupils: Pupils are equal, round, and reactive to light.  Cardiovascular:     Rate and Rhythm: Normal rate and regular rhythm.     Heart sounds: No murmur heard.   Pulmonary:     Effort: Pulmonary effort is normal. No respiratory distress.     Breath sounds: Normal breath sounds.  Chest:     Chest wall: No tenderness.  Abdominal:     Palpations: Abdomen is soft.     Tenderness: There is no abdominal tenderness.  Musculoskeletal:        General: Normal range of motion.     Cervical back: Normal range of motion and neck supple.  Skin:    General: Skin is warm and dry.   Neurological:     General: No focal deficit present.     Mental Status: He is alert and oriented to person, place, and time.     Cranial Nerves: No cranial nerve deficit.     Sensory: No sensory deficit.     Motor: No weakness.     ED Results / Procedures / Treatments   Labs (all labs ordered are listed, but only abnormal results are displayed) Labs Reviewed  CBC WITH DIFFERENTIAL/PLATELET - Abnormal; Notable for the following components:      Result Value   Platelets 422 (*)    All other components within normal limits  RESPIRATORY PANEL BY RT PCR (FLU A&B,  COVID)  BASIC METABOLIC PANEL  BRAIN NATRIURETIC PEPTIDE  TROPONIN I (HIGH SENSITIVITY)  TROPONIN I (HIGH SENSITIVITY)    EKG EKG Interpretation  Date/Time:  Sunday February 10 2020 08:12:04 EST Ventricular Rate:  63 PR Interval:    QRS Duration: 85 QT Interval:  410 QTC Calculation: 420 R Axis:   42 Text Interpretation: Sinus rhythm Confirmed by Vanetta Mulders [phone]) on 02/10/2020 8:37:54 AM   Radiology CT Angio Chest PE W/Cm &/Or Wo Cm  Result Date: 02/10/2020 CLINICAL DATA:  Chest pain and dyspnea. EXAM: CT ANGIOGRAPHY CHEST WITH CONTRAST TECHNIQUE: Multidetector CT imaging of the chest was performed using the standard protocol during bolus administration of intravenous contrast. Multiplanar CT image reconstructions and MIPs were obtained to evaluate the vascular anatomy. CONTRAST:  OMNIPAQUE IOHEXOL 350 MG/ML SOLN COMPARISON:  Chest radiograph from earlier today. 07/21/2006 chest CT angiogram. FINDINGS: Cardiovascular: The study is high quality for the evaluation of pulmonary embolism. There are no filling defects in the central, lobar, segmental or subsegmental pulmonary artery branches to suggest acute pulmonary embolism. Normal course and caliber of the thoracic aorta. Top-normal caliber main pulmonary artery (3.4 cm diameter). Normal heart size. No significant pericardial fluid/thickening.  Mediastinum/Nodes: No discrete thyroid nodules. Unremarkable esophagus. No axillary adenopathy. Mildly enlarged 1.5 cm right hilar node (series 4/image 36), stable since 2008, compatible with a benign reactive etiology. Otherwise no mediastinal or hilar adenopathy. Lungs/Pleura: No pneumothorax. No pleural effusion. Calcified subcentimeter basilar right lower lobe granuloma. Mild patchy focal peribronchovascular consolidation and ground-glass opacity in the posterior right lower lobe. No lung masses or significant pulmonary nodules. Upper abdomen: Small hiatal hernia. Musculoskeletal: No aggressive appearing focal osseous lesions. Mild thoracic spondylosis. Review of the MIP images confirms the above findings. IMPRESSION: 1. No pulmonary embolism. 2. Mild patchy focal peribronchovascular consolidation and ground-glass opacity in the posterior right lower lobe, suspicious for mild bronchopneumonia or aspiration. Follow-up noncontrast chest CT suggested in 3 months to document resolution. 3. Small hiatal hernia. Electronically Signed   By: Delbert Phenix M.D.   On: 02/10/2020 11:28   DG Chest Portable 1 View  Result Date: 02/10/2020 CLINICAL DATA:  Dyspnea, chest pain EXAM: PORTABLE CHEST 1 VIEW COMPARISON:  09/24/2015 chest radiograph. FINDINGS: Stable cardiomediastinal silhouette with top-normal heart size. No pneumothorax. No pleural effusion. Slightly low lung volumes. No overt pulmonary edema. No acute consolidative airspace disease. IMPRESSION: Slightly low lung volumes. No active cardiopulmonary disease. Electronically Signed   By: Delbert Phenix M.D.   On: 02/10/2020 09:10    Procedures Procedures (including critical care time)  Medications Ordered in ED Medications  0.9 %  sodium chloride infusion ( Intravenous Stopped 02/10/20 1335)  iohexol (OMNIPAQUE) 350 MG/ML injection 100 mL (100 mLs Intravenous Contrast Given 02/10/20 1032)  cefTRIAXone (ROCEPHIN) 1 g in sodium chloride 0.9 % 100 mL IVPB (  Intravenous Stopped 02/10/20 1259)  azithromycin (ZITHROMAX) tablet 500 mg (500 mg Oral Given 02/10/20 1222)    ED Course  I have reviewed the triage vital signs and the nursing notes.  Pertinent labs & imaging results that were available during my care of the patient were reviewed by me and considered in my medical decision making (see chart for details).    MDM Rules/Calculators/A&P                          Patient clinically nontoxic no acute distress.  Oxygen saturation on room air is in the 99% range.  Not  tachycardic.  Blood pressure is normal.  Clinically sounds as if cough started first and then developed the anterior chest discomfort which is made worse by taking a deep breath and by coughing.  Making sound chest wall in nature.  We will check for Covid will get troponins check basic labs and chest x-ray.  Clinically not necessarily concerned about pulmonary embolus.  However patient does have some risk factors for that.  Being obese.   Patient CT angio without pulmonary embolus.  But does show evidence of concern for right lower lobe pneumonia.  Patient given 1 g of Rocephin here and patient received p.o. Zithromax.  Patient will be continued on Zithromax at home.  Follow-up with his doctor.  Symptoms certainly consistent with a bronchitis.  Covid testing was negative.  Troponins x2 without any significant abnormalities.  Patient nontoxic no acute distress   Final Clinical Impression(s) / ED Diagnoses Final diagnoses:  Chest wall pain  Community acquired pneumonia of right lower lobe of lung    Rx / DC Orders ED Discharge Orders         Ordered    azithromycin (ZITHROMAX) 250 MG tablet  Daily        02/10/20 1431           Vanetta Mulders, MD 02/10/20 1433

## 2021-02-16 IMAGING — CT CT ANGIO CHEST
3 of 9 series · 18 of 36 positions shown · IV contrast (omnipaque)
Comparison: Chest radiograph from earlier today. 07/21/2006 chest
CT angiogram.

CLINICAL DATA: Chest pain and dyspnea.

EXAM:
CT ANGIOGRAPHY CHEST WITH CONTRAST
TECHNIQUE: Multidetector CT imaging of the chest was performed using the
standard protocol during bolus administration of intravenous
contrast. Multiplanar CT image reconstructions and MIPs were
obtained to evaluate the vascular anatomy.
CONTRAST:  100mL OMNIPAQUE IOHEXOL 350 MG/ML SOLN

[Series 5: pe thins · axial · 0.85mm/px · z∈[-208,+26]mm · 14 of 270 slices shown]
[im 18/270  lung]
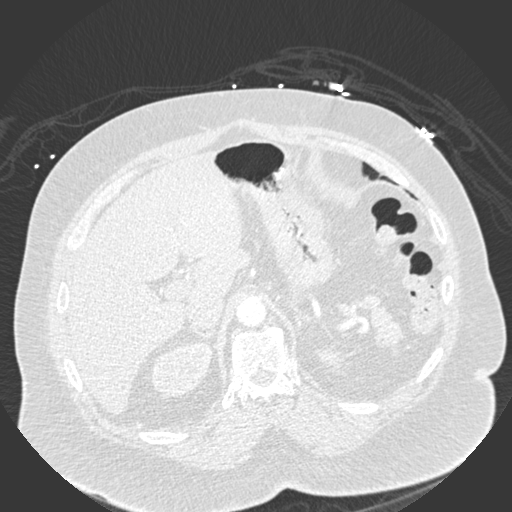
[im 36/270  mediastinal]
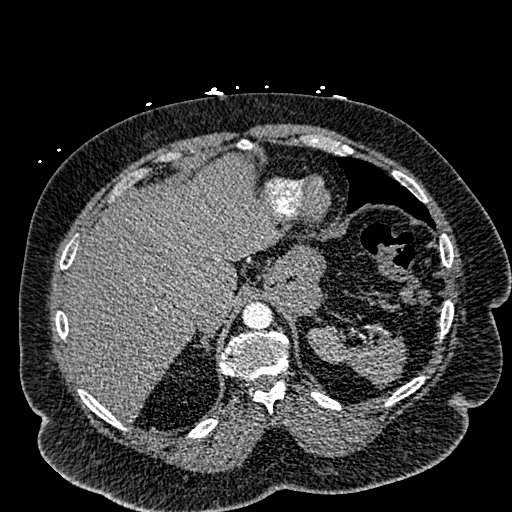
[im 54/270  lung]
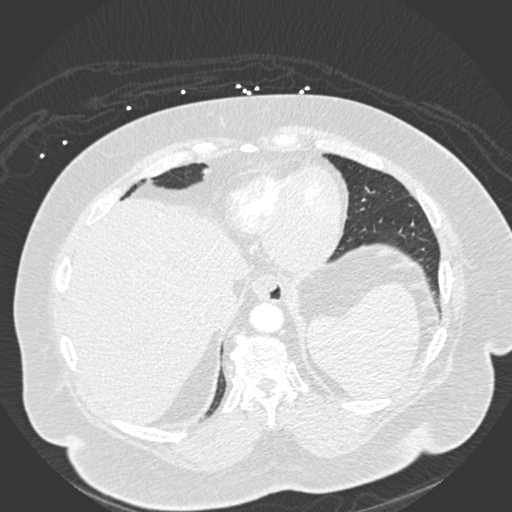
[im 72/270  mediastinal]
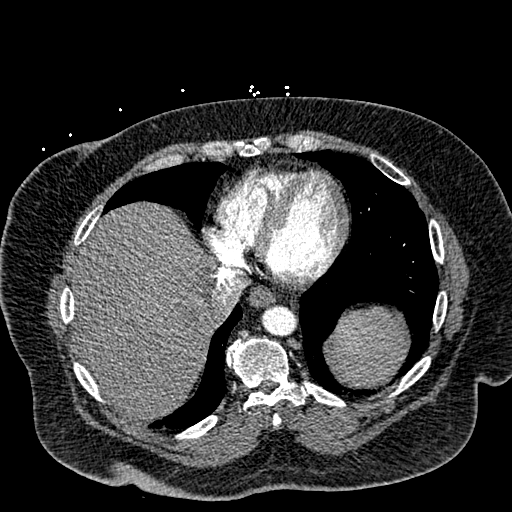
[im 90/270  lung]
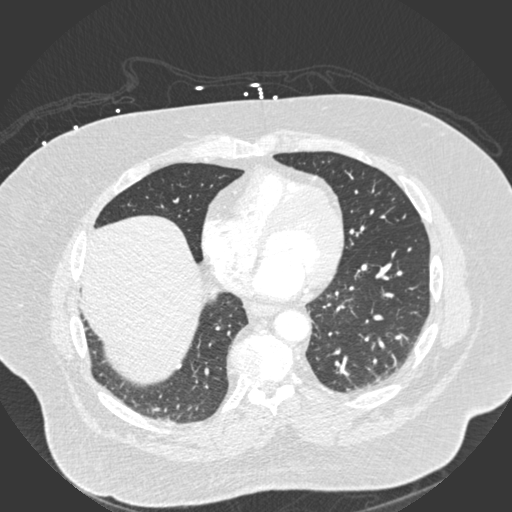
[im 108/270  mediastinal]
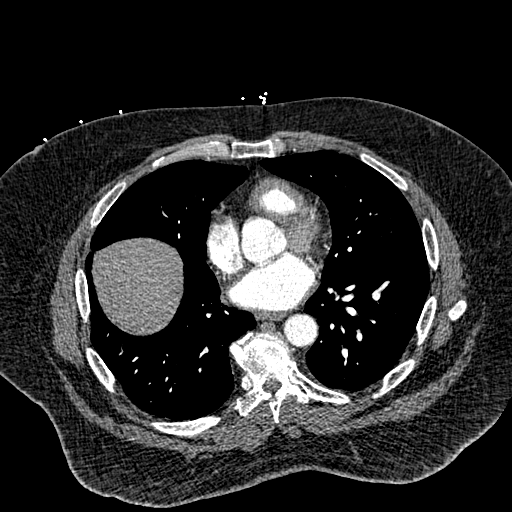
[im 126/270  lung]
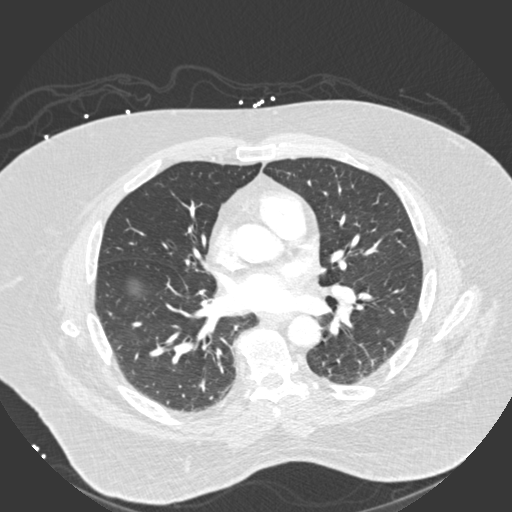
[im 144/270  mediastinal]
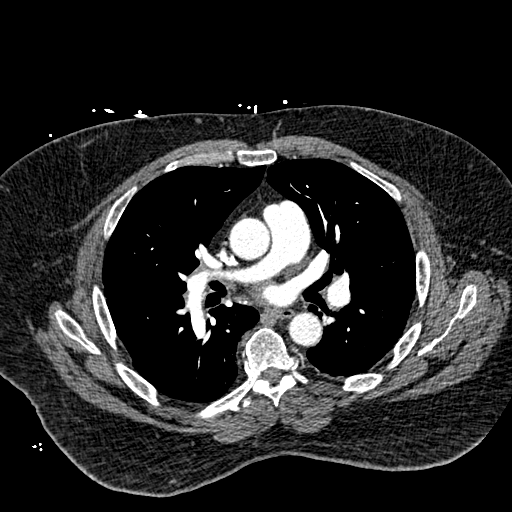
[im 162/270  lung]
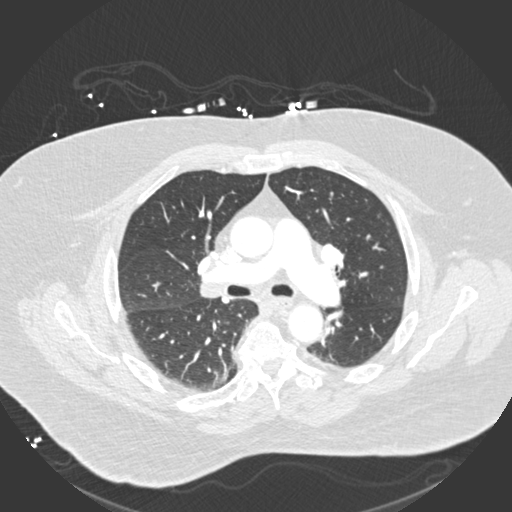
[im 180/270  mediastinal]
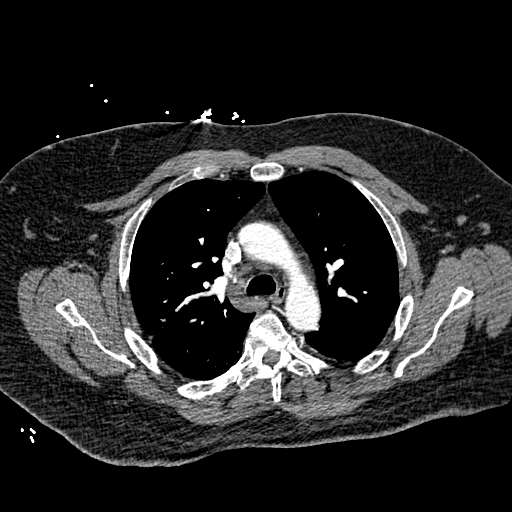
[im 198/270  lung]
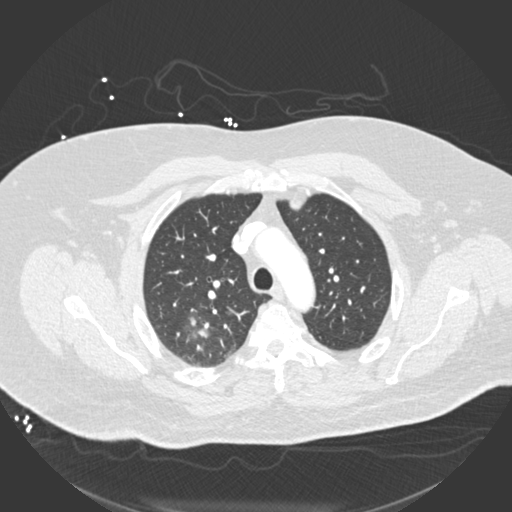
[im 216/270  mediastinal]
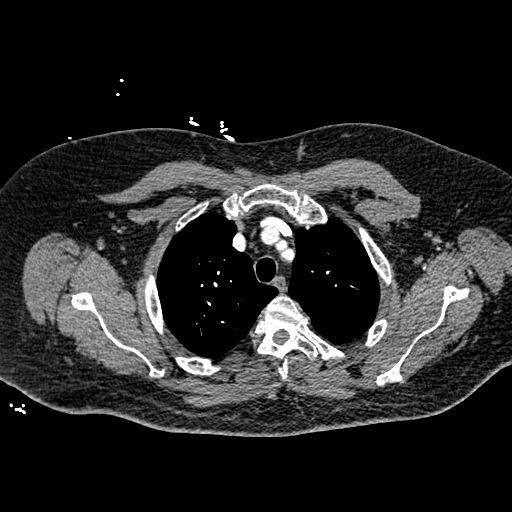
[im 234/270  lung]
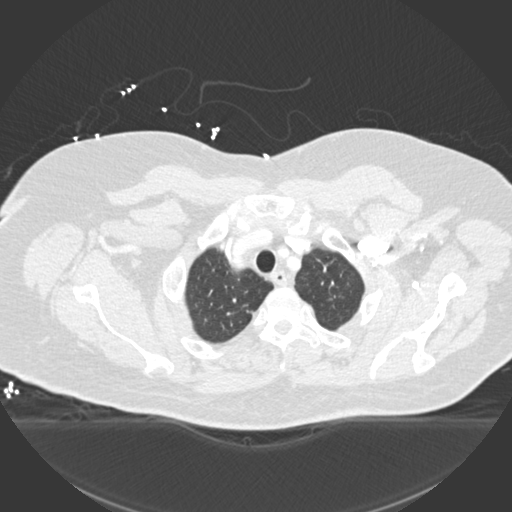
[im 252/270  mediastinal]
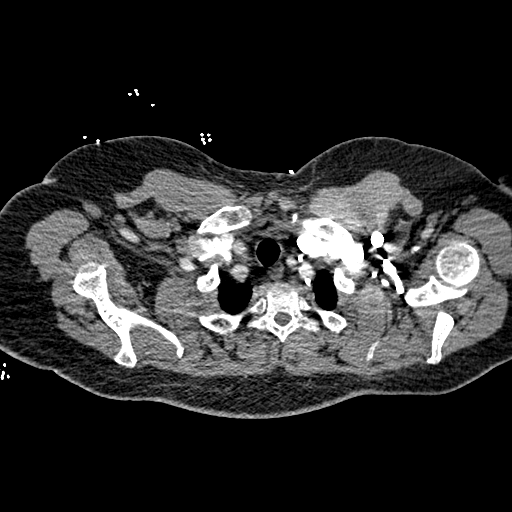

[Series 6: pe lung · axial · 0.98mm/px · z∈[-142,-18]mm · 3 of 83 slices shown]
[im 21/83  mediastinal]
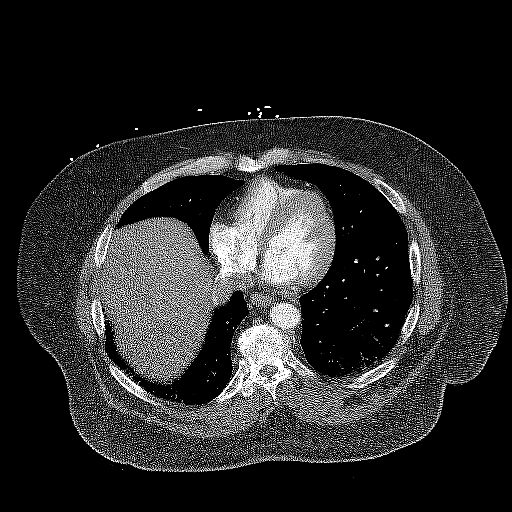
[im 42/83  mediastinal]
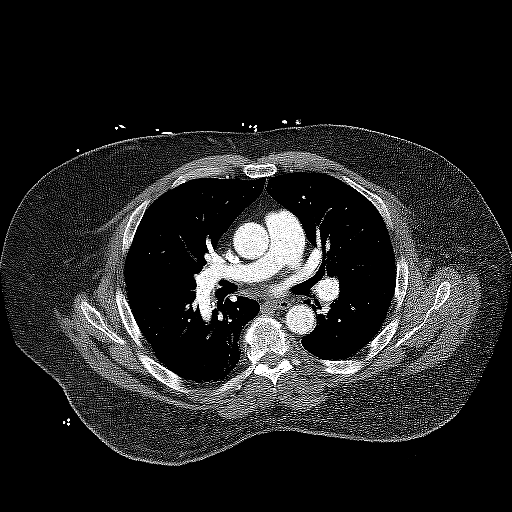
[im 62/83  mediastinal]
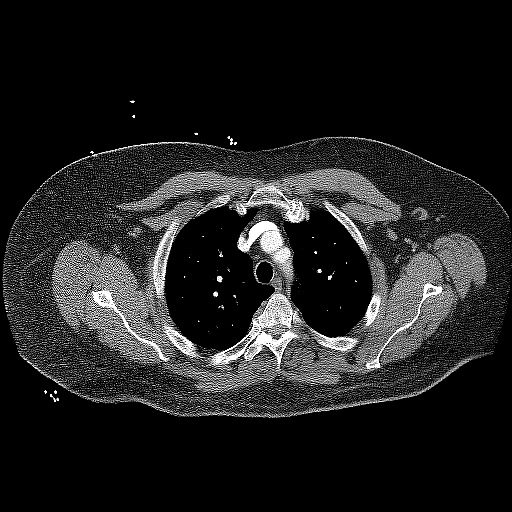

[Series 7: pe coronal mpr · coronal · 0.57mm/px · 1 of 174 slices shown]
[im 87/174  mediastinal]
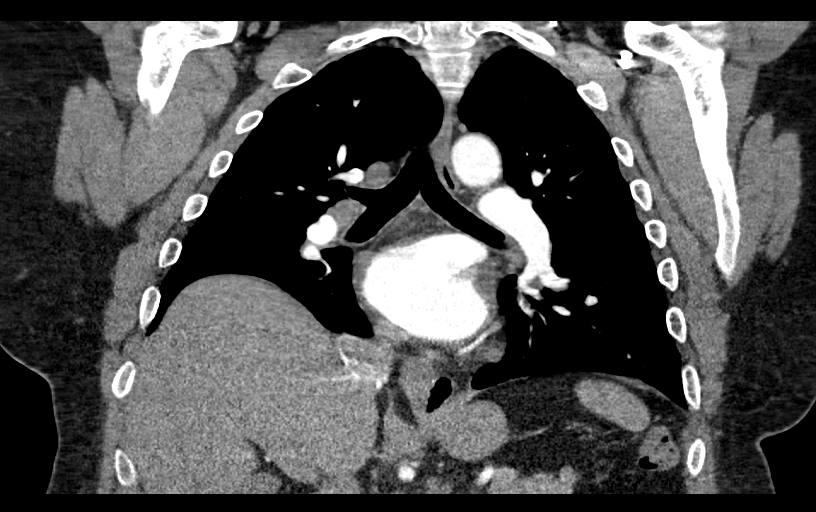

[18 of 36 positions shown; findings below may reference images not displayed]

FINDINGS: Cardiovascular: The study is high quality for the evaluation of
pulmonary embolism. There are no filling defects in the central,
lobar, segmental or subsegmental pulmonary artery branches to
suggest acute pulmonary embolism. Normal course and caliber of the
thoracic aorta. Top-normal caliber main pulmonary artery (3.4 cm
diameter). Normal heart size. No significant pericardial
fluid/thickening.

Mediastinum/Nodes: No discrete thyroid nodules. Unremarkable
esophagus. No axillary adenopathy. Mildly enlarged 1.5 cm right
hilar node (series 4/image 36), stable since 1442, compatible with a
benign reactive etiology. Otherwise no mediastinal or hilar
adenopathy.

Lungs/Pleura: No pneumothorax. No pleural effusion. Calcified
subcentimeter basilar right lower lobe granuloma. Mild patchy focal
peribronchovascular consolidation and ground-glass opacity in the
posterior right lower lobe. No lung masses or significant pulmonary
nodules.

Upper abdomen: Small hiatal hernia.

Musculoskeletal: No aggressive appearing focal osseous lesions. Mild
thoracic spondylosis.

Review of the MIP images confirms the above findings.
IMPRESSION: 1. No pulmonary embolism.
2. Mild patchy focal peribronchovascular consolidation and
ground-glass opacity in the posterior right lower lobe, suspicious
for mild bronchopneumonia or aspiration. Follow-up noncontrast chest
CT suggested in 3 months to document resolution.
3. Small hiatal hernia.
